# Patient Record
Sex: Male | Born: 1975 | Race: White | Hispanic: No | Marital: Married | Smoking: Never smoker
Health system: Southern US, Community
[De-identification: ages and names within clinical notes are randomized; demographics above are authoritative.]

## PROBLEM LIST (undated history)

## (undated) DIAGNOSIS — Z87442 Personal history of urinary calculi: Secondary | ICD-10-CM

## (undated) DIAGNOSIS — F32A Depression, unspecified: Secondary | ICD-10-CM

## (undated) DIAGNOSIS — K219 Gastro-esophageal reflux disease without esophagitis: Secondary | ICD-10-CM

## (undated) DIAGNOSIS — T7840XA Allergy, unspecified, initial encounter: Secondary | ICD-10-CM

## (undated) DIAGNOSIS — K5792 Diverticulitis of intestine, part unspecified, without perforation or abscess without bleeding: Secondary | ICD-10-CM

## (undated) DIAGNOSIS — B3781 Candidal esophagitis: Secondary | ICD-10-CM

## (undated) DIAGNOSIS — B019 Varicella without complication: Secondary | ICD-10-CM

## (undated) DIAGNOSIS — F329 Major depressive disorder, single episode, unspecified: Secondary | ICD-10-CM

## (undated) HISTORY — DX: Allergy, unspecified, initial encounter: T78.40XA

## (undated) HISTORY — PX: ROTATOR CUFF REPAIR: SHX139

## (undated) HISTORY — PX: VASECTOMY: SHX75

## (undated) HISTORY — DX: Major depressive disorder, single episode, unspecified: F32.9

## (undated) HISTORY — DX: Varicella without complication: B01.9

## (undated) HISTORY — DX: Gastro-esophageal reflux disease without esophagitis: K21.9

## (undated) HISTORY — DX: Depression, unspecified: F32.A

## (undated) HISTORY — DX: Diverticulitis of intestine, part unspecified, without perforation or abscess without bleeding: K57.92

## (undated) HISTORY — DX: Personal history of urinary calculi: Z87.442

## (undated) HISTORY — DX: Candidal esophagitis: B37.81

---

## 2010-03-26 HISTORY — PX: HERNIA REPAIR: SHX51

## 2011-08-27 ENCOUNTER — Encounter: Payer: Self-pay | Admitting: Family Medicine

## 2011-08-27 ENCOUNTER — Encounter: Payer: Self-pay | Admitting: Gastroenterology

## 2011-08-27 ENCOUNTER — Ambulatory Visit (INDEPENDENT_AMBULATORY_CARE_PROVIDER_SITE_OTHER): Payer: 59 | Admitting: Family Medicine

## 2011-08-27 VITALS — BP 118/90 | HR 72 | Temp 97.7°F | Resp 12 | Ht 69.0 in | Wt 167.0 lb

## 2011-08-27 DIAGNOSIS — K219 Gastro-esophageal reflux disease without esophagitis: Secondary | ICD-10-CM

## 2011-08-27 DIAGNOSIS — N200 Calculus of kidney: Secondary | ICD-10-CM

## 2011-08-27 DIAGNOSIS — K222 Esophageal obstruction: Secondary | ICD-10-CM

## 2011-08-27 DIAGNOSIS — J309 Allergic rhinitis, unspecified: Secondary | ICD-10-CM

## 2011-08-27 MED ORDER — AZELASTINE HCL 0.1 % NA SOLN: 1.0000 | Freq: Two times a day (BID) | NASAL | Status: DC

## 2011-08-27 MED ORDER — OLOPATADINE HCL 0.1 % OP SOLN: 1.0000 [drp] | Freq: Two times a day (BID) | OPHTHALMIC | Status: DC

## 2011-08-27 NOTE — Progress Notes (Signed)
  Subjective:    Patient ID: Albert Compton, male    DOB: 1975-12-19, 36 y.o.   MRN: [id number]  HPI  New patient to establish care. Patient has history of GERD which apparently has been more silent type GERD. Had 2 previous endoscopies with reported esophageal stricture dilatation. Most recently over a year ago when he lived in [place]. He's had progressive symptoms of dysphagia over the past several months and frequently unable to finish meals and has to regurgitate food. Previously has taken some Prilosec but did not see much improvement. He is currently not taking any acid suppressors. Increased caffeine use-coffee. Nonsmoker. No alcohol use.  Other problems include history of calcium oxalate kidney stones x4. He has had reported one episode of mild diverticulitis noted on CT scan. He has seasonal allergies which have been particularly bothersome since he moved to [place]. Uses Astelin nasal along with Patanol eyedrops and Allegra.    Review of Systems  Constitutional: Negative for fever, chills and unexpected weight change.  HENT: Positive for congestion and sinus pressure. Negative for sore throat, trouble swallowing and voice change.   Eyes: Positive for redness and itching. Negative for pain and visual disturbance.  Respiratory: Negative for cough, shortness of breath and wheezing.   Cardiovascular: Negative for chest pain.  Neurological: Negative for dizziness and headaches.  Hematological: Negative for adenopathy.       Objective:   Physical Exam  Constitutional: He appears well-developed and well-nourished.  HENT:  Right Ear: External ear normal.  Left Ear: External ear normal.  Mouth/Throat: Oropharynx is clear and moist.  Neck: Neck supple. No thyromegaly present.  Cardiovascular: Normal rate and regular rhythm.   Pulmonary/Chest: Effort normal and breath sounds normal. No respiratory distress. He has no wheezes. He has no rales.  Abdominal: Soft. He exhibits no  distension and no mass. There is no tenderness. There is no rebound and no guarding.  Musculoskeletal: He exhibits no edema.  Lymphadenopathy:    He has no cervical adenopathy.          Assessment & Plan:  #1 history of GERD with reported esophageal stricture presenting now with progressive dysphagia. Samples Dexilant 60 mg 1 daily. Reduce caffeine use. Referral to local gastroenterologist. #2 allergic rhinitis. Refill Astelin and Patanol eyedrops.  #3 history of calcium oxalate kidney stones. Low oxalate diet and plenty of fluids.

## 2011-08-27 NOTE — Patient Instructions (Signed)
Dexilant 60 mg one daily We will call you regarding appointment with gastroenterology

## 2011-09-01 ENCOUNTER — Encounter (HOSPITAL_COMMUNITY): Payer: Self-pay

## 2011-09-01 ENCOUNTER — Emergency Department (HOSPITAL_COMMUNITY)
Admission: EM | Admit: 2011-09-01 | Discharge: 2011-09-01 | Disposition: A | Discharge status: Home / Self Care | Payer: 59 | Source: Home / Self Care | Attending: Emergency Medicine | Admitting: Emergency Medicine

## 2011-09-01 DIAGNOSIS — J020 Streptococcal pharyngitis: Secondary | ICD-10-CM

## 2011-09-01 LAB — POCT RAPID STREP A: Streptococcus, Group A Screen (Direct): POSITIVE — AB

## 2011-09-01 MED ORDER — AMOXICILLIN 250 MG/5ML PO SUSR: ORAL | Status: DC

## 2011-09-01 NOTE — ED Provider Notes (Signed)
History     CSN: [id number]  Arrival date & time 09/01/11  1210   First MD Initiated Contact with Patient 09/01/11 1253      Chief Complaint  Patient presents with  . Sore Throat    (Consider location/radiation/quality/duration/timing/severity/associated sxs/prior treatment) HPI Comments: Shouldn't presents urgent care complaining of a sore throat started yesterday they both report that a child at home also tested positive for strep during the course of last week. Patient denies any fevers but does hurt to swallow, denies any shortness of breath or cough. She also denies constitutional symptoms such as body aches or chills or changes in appetite, or arthralgias  Patient is a 36 y.o. male presenting with pharyngitis. The history is provided by the patient.  Sore Throat This is a new problem. The current episode started yesterday. The problem occurs constantly. Pertinent negatives include no abdominal pain and no shortness of breath. The symptoms are aggravated by swallowing. The symptoms are relieved by nothing. He has tried nothing for the symptoms.    Past Medical History  Diagnosis Date  . Chicken pox   . Diverticulitis   . GERD (gastroesophageal reflux disease)   . Allergy   . Kidney stones     Past Surgical History  Procedure Date  . Hernia repair 2012    umbilical  . Vasectomy     Family History  Problem Relation Age of Onset  . Alcohol abuse Mother   . Hypertension Mother   . Alcohol abuse Father   . Mental illness Father   . Alcohol abuse Sister   . Mental illness Sister   . Alcohol abuse Maternal Aunt   . Alcohol abuse Maternal Uncle   . Alcohol abuse Paternal Aunt   . Alcohol abuse Paternal Uncle     History  Substance Use Topics  . Smoking status: Never Smoker   . Smokeless tobacco: Not on file  . Alcohol Use: No      Review of Systems  Constitutional: Negative for fever and activity change.  HENT: Positive for sore throat. Negative for ear  pain, facial swelling and neck pain.   Respiratory: Negative for cough and shortness of breath.   Gastrointestinal: Negative for abdominal pain.  Skin: Negative for rash.    Allergies  Review of patient's allergies indicates no known allergies.  Home Medications   Current Outpatient Rx  Name Route Sig Dispense Refill  . AMOXICILLIN 250 MG/5ML PO SUSR  10 cc po tid x 10 days 300 mL 0  . AZELASTINE HCL 137 MCG/SPRAY NA SOLN Nasal Place 1 spray into the nose 2 (two) times daily. Use in each nostril as directed 30 mL 11  . FEXOFENADINE HCL 180 MG PO TABS Oral Take 180 mg by mouth daily.    Marland Kitchen LORATADINE-PSEUDOEPHEDRINE ER 10-240 MG PO TB24 Oral Take 1 tablet by mouth daily.    . OLOPATADINE HCL 0.1 % OP SOLN Both Eyes Place 1 drop into both eyes 2 (two) times daily. 5 mL 12    BP 100/75  Pulse 84  Temp(Src) 98.3 F (36.8 C) (Oral)  Resp 18  SpO2 100%  Physical Exam  Vitals reviewed. Constitutional: He appears well-developed and well-nourished.  HENT:  Right Ear: Tympanic membrane normal.  Left Ear: Tympanic membrane normal.  Mouth/Throat: Uvula is midline. Posterior oropharyngeal erythema present.  Eyes: Conjunctivae are normal.  Neck: Neck supple. No thyromegaly present.  Abdominal: There is no tenderness.  Skin: No rash noted. No erythema.  ED Course  Procedures (including critical care time)  Labs Reviewed  POCT RAPID STREP A ([facility]RG CARE ONLY) - Abnormal; Notable for the following:    Streptococcus, Group A Screen (Direct) POSITIVE (*)    All other components within normal limits   No results found.   1. Streptococcal pharyngitis       MDM  Positive contact at home with known positive strep and symptomatic since yesterday. Exam with marked pharyngitis. Patient requesting antibiotic suspension as he's undergoing some gastrointestinal evaluation for some esophageal strictures and has difficulty swallowing pills.        Jimmie Molly, MD 09/01/11 [phone]

## 2011-09-01 NOTE — Discharge Instructions (Signed)
Strep Throat Strep throat is an infection of the throat caused by a bacteria named Streptococcus pyogenes. Your caregiver may call the infection streptococcal "tonsillitis" or "pharyngitis" depending on whether there are signs of inflammation in the tonsils or back of the throat. Strep throat is most common in children from 28 to 35 years old during the cold months of the year, but it can occur in people of any age during any season. This infection is spread from person to person (contagious) through coughing, sneezing, or other close contact. SYMPTOMS   Fever or chills.   Painful, swollen, red tonsils or throat.   Pain or difficulty when swallowing.   White or yellow spots on the tonsils or throat.   Swollen, tender lymph nodes or "glands" of the neck or under the jaw.   Red rash all over the body (rare).  DIAGNOSIS  Many different infections can cause the same symptoms. A test must be done to confirm the diagnosis so the right treatment can be given. A "rapid strep test" can help your caregiver make the diagnosis in a few minutes. If this test is not available, a light swab of the infected area can be used for a throat culture test. If a throat culture test is done, results are usually available in a day or two. TREATMENT  Strep throat is treated with antibiotic medicine. HOME CARE INSTRUCTIONS   Gargle with 1 tsp of salt in 1 cup of warm water, 3 to 4 times per day or as needed for comfort.   Family members who also have a sore throat or fever should be tested for strep throat and treated with antibiotics if they have the strep infection.   Make sure everyone in your household washes their hands well.   Do not share food, drinking cups, or personal items that could cause the infection to spread to others.   You may need to eat a soft food diet until your sore throat gets better.   Drink enough water and fluids to keep your urine clear or pale yellow. This will help prevent  dehydration.   Get plenty of rest.   Stay home from school, daycare, or work until you have been on antibiotics for 24 hours.   Only take over-the-counter or prescription medicines for pain, discomfort, or fever as directed by your caregiver.   If antibiotics are prescribed, take them as directed. Finish them even if you start to feel better.  SEEK MEDICAL CARE IF:   The glands in your neck continue to enlarge.   You develop a rash, cough, or earache.   You cough up green, yellow-brown, or bloody sputum.   You have pain or discomfort not controlled by medicines.   Your problems seem to be getting worse rather than better.  SEEK IMMEDIATE MEDICAL CARE IF:   You develop any new symptoms such as vomiting, severe headache, stiff or painful neck, chest pain, shortness of breath, or trouble swallowing.   You develop severe throat pain, drooling, or changes in your voice.   You develop swelling of the neck, or the skin on the neck becomes red and tender.   You have a fever.   You develop signs of dehydration, such as fatigue, dry mouth, and decreased urination.   You become increasingly sleepy, or you cannot wake up completely.  Document Released: 03/09/2000 Document Revised: 03/01/2011 Document Reviewed: 05/11/2010 [facility] Patient Information 2012 [place], [place].Strep Throat Strep throat is an infection of the throat caused by  a bacteria named Streptococcus pyogenes. Your caregiver may call the infection streptococcal "tonsillitis" or "pharyngitis" depending on whether there are signs of inflammation in the tonsils or back of the throat. Strep throat is most common in children from 60 to 38 years old during the cold months of the year, but it can occur in people of any age during any season. This infection is spread from person to person (contagious) through coughing, sneezing, or other close contact. SYMPTOMS   Fever or chills.   Painful, swollen, red tonsils or throat.   Pain  or difficulty when swallowing.   White or yellow spots on the tonsils or throat.   Swollen, tender lymph nodes or "glands" of the neck or under the jaw.   Red rash all over the body (rare).  DIAGNOSIS  Many different infections can cause the same symptoms. A test must be done to confirm the diagnosis so the right treatment can be given. A "rapid strep test" can help your caregiver make the diagnosis in a few minutes. If this test is not available, a light swab of the infected area can be used for a throat culture test. If a throat culture test is done, results are usually available in a day or two. TREATMENT  Strep throat is treated with antibiotic medicine. HOME CARE INSTRUCTIONS   Gargle with 1 tsp of salt in 1 cup of warm water, 3 to 4 times per day or as needed for comfort.   Family members who also have a sore throat or fever should be tested for strep throat and treated with antibiotics if they have the strep infection.   Make sure everyone in your household washes their hands well.   Do not share food, drinking cups, or personal items that could cause the infection to spread to others.   You may need to eat a soft food diet until your sore throat gets better.   Drink enough water and fluids to keep your urine clear or pale yellow. This will help prevent dehydration.   Get plenty of rest.   Stay home from school, daycare, or work until you have been on antibiotics for 24 hours.   Only take over-the-counter or prescription medicines for pain, discomfort, or fever as directed by your caregiver.   If antibiotics are prescribed, take them as directed. Finish them even if you start to feel better.  SEEK MEDICAL CARE IF:   The glands in your neck continue to enlarge.   You develop a rash, cough, or earache.   You cough up green, yellow-brown, or bloody sputum.   You have pain or discomfort not controlled by medicines.   Your problems seem to be getting worse rather than  better.  SEEK IMMEDIATE MEDICAL CARE IF:   You develop any new symptoms such as vomiting, severe headache, stiff or painful neck, chest pain, shortness of breath, or trouble swallowing.   You develop severe throat pain, drooling, or changes in your voice.   You develop swelling of the neck, or the skin on the neck becomes red and tender.   You have a fever.   You develop signs of dehydration, such as fatigue, dry mouth, and decreased urination.   You become increasingly sleepy, or you cannot wake up completely.  Document Released: 03/09/2000 Document Revised: 03/01/2011 Document Reviewed: 05/11/2010 [facility] Patient Information 2012 [place], [place].

## 2011-09-01 NOTE — ED Notes (Signed)
Pt has sorethroat and difficulty swallowing since yesterday.  He has had two esophageal dilatations in the past and has appt with GI in near future.

## 2011-09-21 ENCOUNTER — Ambulatory Visit (INDEPENDENT_AMBULATORY_CARE_PROVIDER_SITE_OTHER): Payer: 59 | Admitting: Gastroenterology

## 2011-09-21 ENCOUNTER — Other Ambulatory Visit (INDEPENDENT_AMBULATORY_CARE_PROVIDER_SITE_OTHER): Payer: 59

## 2011-09-21 ENCOUNTER — Encounter: Payer: Self-pay | Admitting: Gastroenterology

## 2011-09-21 VITALS — BP 104/72 | HR 88 | Ht 69.0 in | Wt 165.0 lb

## 2011-09-21 DIAGNOSIS — K219 Gastro-esophageal reflux disease without esophagitis: Secondary | ICD-10-CM

## 2011-09-21 DIAGNOSIS — R1314 Dysphagia, pharyngoesophageal phase: Secondary | ICD-10-CM

## 2011-09-21 DIAGNOSIS — T7840XA Allergy, unspecified, initial encounter: Secondary | ICD-10-CM

## 2011-09-21 DIAGNOSIS — R197 Diarrhea, unspecified: Secondary | ICD-10-CM

## 2011-09-21 DIAGNOSIS — R1310 Dysphagia, unspecified: Secondary | ICD-10-CM

## 2011-09-21 DIAGNOSIS — Z889 Allergy status to unspecified drugs, medicaments and biological substances status: Secondary | ICD-10-CM

## 2011-09-21 LAB — IBC PANEL
Iron: 66 ug/dL (ref 42–165)
Transferrin: 226.1 mg/dL (ref 212.0–360.0)

## 2011-09-21 LAB — COMPREHENSIVE METABOLIC PANEL
Albumin: 4.1 g/dL (ref 3.5–5.2)
BUN: 13 mg/dL (ref 6–23)
Calcium: 9.2 mg/dL (ref 8.4–10.5)
Chloride: 107 mEq/L (ref 96–112)
Glucose, Bld: 96 mg/dL (ref 70–99)
Potassium: 4.4 mEq/L (ref 3.5–5.1)

## 2011-09-21 LAB — CBC WITH DIFFERENTIAL/PLATELET
Basophils Relative: 0.5 % (ref 0.0–3.0)
Eosinophils Absolute: 0.8 10*3/uL — ABNORMAL HIGH (ref 0.0–0.7)
Eosinophils Relative: 11 % — ABNORMAL HIGH (ref 0.0–5.0)
Lymphocytes Relative: 32.3 % (ref 12.0–46.0)
MCHC: 34 g/dL (ref 30.0–36.0)
Neutrophils Relative %: 48.4 % (ref 43.0–77.0)
RBC: 5.22 Mil/uL (ref 4.22–5.81)
WBC: 7 10*3/uL (ref 4.5–10.5)

## 2011-09-21 LAB — VITAMIN B12: Vitamin B-12: 238 pg/mL (ref 211–911)

## 2011-09-21 LAB — TSH: TSH: 2.35 u[IU]/mL (ref 0.35–5.50)

## 2011-09-21 NOTE — Patient Instructions (Addendum)
You have been scheduled for an endoscopy with fentanyl and versed (due to egg allergy). Please follow written instructions given to you at your visit today. Your physician has requested that you go to the basement for the following lab work before leaving today: [place] Panel, Anemia Profile, Celiac 10 Panel. Please follow a dysphagia level 1 diet. A handout has been given to you regarding this. CC: Dr Evelena Peat

## 2011-09-21 NOTE — Progress Notes (Signed)
History of Present Illness:  This is a very nice 36 year old Caucasian male self referred for evaluation of recurrent dysphagia over the last 5-6 years. He previously lived in [place] and had esophageal dilatation on 2 occasions. Apparently he had" rings" and was told that he had" silent GERD". He has not had any typical acid reflux symptoms of regurgitation or burning substernal chest pain. He is been on PPI agents in the past without any improvement. Currently he has solid food dysphagia in the upper esophageal area and is on a pured diet. He also has IBS with gastrointestinal or urgency with fried foods and junk food. Apparently previous CT scan of the abdomen showed" diverticulosis". Patient not had melena, hematochezia, hepatobiliary or systemic complaints. He does not smoke, abuse NSAIDs or alcohol. Family history is noncontributory. He does have recurrent calcium oxalate kidney stones. Patient also has a history of marked allergies as previously been on allergy shots and antihistamines. He currently uses Astelin nasal spray. He denies a specific food allergies, and apparently previous serologies for celiac disease were negative.  I have reviewed this patient's present history, medical and surgical past history, allergies and medications.     ROS: The remainder of the 10 point ROS is negative... no recurrent skin rashes, joint pains, oral stomatitis, or neurological problems. 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     Physical Exam: Blood pressure 104/72, pulse 80 and regular, and weight 165 pounds. General well developed well nourished patient in no acute distress, appearing their stated age Eyes PERRLA, no icterus, fundoscopic exam per opthamologist Skin no lesions noted Neck supple, no adenopathy, no thyroid enlargement, no tenderness Chest clear to percussion and auscultation Heart no significant murmurs, gallops or rubs noted Abdomen no hepatosplenomegaly masses or tenderness, BS normal.    Extremities no acute joint lesions, edema, phlebitis or evidence of cellulitis. Neurologic patient oriented x 3, cranial nerves intact, no focal neurologic deficits noted. Psychological mental status normal and normal affect.  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 He certainly has no symptoms of chronic GERD. We will proceed with endoscopy and esophageal biopsies with dilatation as indicated. I have placed him on a very restricted Level One dysphagia diet pending endoscopy and probable dilation. 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 I have ordered labs reviewed including repeat celiac serologies. The risk and benefits of these procedures have been explained in detail, his wife is in attendance, and they have agreed to proceed as planned. Because of his egg allergy, he is not a candidate for propofol administration. We will try to secure his records from his previous physicians in [place] for review.  Encounter Diagnosis  Name Primary?  . GERD (gastroesophageal reflux disease) Yes

## 2011-09-24 ENCOUNTER — Other Ambulatory Visit: Payer: Self-pay

## 2011-09-24 DIAGNOSIS — E538 Deficiency of other specified B group vitamins: Secondary | ICD-10-CM

## 2011-09-24 LAB — CELIAC PANEL 10
Gliadin IgA: 4.2 U/mL (ref <20)
Tissue Transglut Ab: 3.6 U/mL (ref <20)
Tissue Transglutaminase Ab, IgA: 3.8 U/mL (ref <20)

## 2011-09-24 MED ORDER — CYANOCOBALAMIN 1000 MCG/ML IJ SOLN
1000.0000 ug | INTRAMUSCULAR | Status: AC
  Administered 2011-09-25 – 2011-10-02 (×2): 1000 ug via INTRAMUSCULAR

## 2011-09-25 ENCOUNTER — Ambulatory Visit (INDEPENDENT_AMBULATORY_CARE_PROVIDER_SITE_OTHER): Payer: 59 | Admitting: Gastroenterology

## 2011-09-25 DIAGNOSIS — E538 Deficiency of other specified B group vitamins: Secondary | ICD-10-CM

## 2011-09-25 MED ORDER — CYANOCOBALAMIN 1000 MCG/ML IJ SOLN
1000.0000 ug | Freq: Once | INTRAMUSCULAR | Status: AC
  Administered 2011-10-09: 1000 ug via INTRAMUSCULAR

## 2011-09-25 NOTE — Patient Instructions (Signed)
Pt given copy of his lab work done 09/21/11 per his request.

## 2011-10-02 ENCOUNTER — Ambulatory Visit (INDEPENDENT_AMBULATORY_CARE_PROVIDER_SITE_OTHER): Payer: 59 | Admitting: Gastroenterology

## 2011-10-02 DIAGNOSIS — E538 Deficiency of other specified B group vitamins: Secondary | ICD-10-CM

## 2011-10-03 ENCOUNTER — Other Ambulatory Visit: Payer: Self-pay

## 2011-10-03 ENCOUNTER — Ambulatory Visit (AMBULATORY_SURGERY_CENTER): Payer: 59 | Admitting: Gastroenterology

## 2011-10-03 ENCOUNTER — Other Ambulatory Visit: Payer: 59

## 2011-10-03 ENCOUNTER — Encounter: Payer: Self-pay | Admitting: Gastroenterology

## 2011-10-03 VITALS — BP 116/82 | HR 63 | Temp 96.5°F | Resp 19 | Ht 69.0 in | Wt 165.0 lb

## 2011-10-03 DIAGNOSIS — B3781 Candidal esophagitis: Secondary | ICD-10-CM

## 2011-10-03 DIAGNOSIS — K209 Esophagitis, unspecified: Secondary | ICD-10-CM

## 2011-10-03 DIAGNOSIS — K222 Esophageal obstruction: Secondary | ICD-10-CM

## 2011-10-03 DIAGNOSIS — K219 Gastro-esophageal reflux disease without esophagitis: Secondary | ICD-10-CM

## 2011-10-03 DIAGNOSIS — R1314 Dysphagia, pharyngoesophageal phase: Secondary | ICD-10-CM

## 2011-10-03 MED ORDER — FLUCONAZOLE 100 MG PO TABS: ORAL_TABLET | ORAL | Status: DC

## 2011-10-03 MED ORDER — SODIUM CHLORIDE 0.9 % IV SOLN: 500.0000 mL | INTRAVENOUS | Status: DC

## 2011-10-03 NOTE — Progress Notes (Signed)
Patient did not experience any of the following events: a burn prior to discharge; a fall within the facility; wrong site/side/patient/procedure/implant event; or a hospital transfer or hospital admission upon discharge from the facility. (G8907) Patient did not have preoperative order for IV antibiotic SSI prophylaxis. (G8918)  

## 2011-10-03 NOTE — Progress Notes (Signed)
No complaints noted in the recovery room. Maw   

## 2011-10-03 NOTE — Patient Instructions (Addendum)
Please follow the dilatation diet the rest of the day.  Rx was sent to the [facility] for Diflucan to be picked up.  You are to have blood work today before discharge to your home.  You have a follow up appointment on Friday, October 19, 2011 at 9:45am on the 3rd floor.  You may resume your current medications today.  Call if any questions or concerns.    YOU HAD AN ENDOSCOPIC PROCEDURE TODAY AT THE  ENDOSCOPY CENTER: Refer to the procedure report that was given to you for any specific questions about what was found during the examination.  If the procedure report does not answer your questions, please call your gastroenterologist to clarify.  If you requested that your care partner not be given the details of your procedure findings, then the procedure report has been included in a sealed envelope for you to review at your convenience later.  YOU SHOULD EXPECT: Some feelings of bloating in the abdomen. Passage of more gas than usual.  Walking can help get rid of the air that was put into your GI tract during the procedure and reduce the bloating. If you had a lower endoscopy (such as a colonoscopy or flexible sigmoidoscopy) you may notice spotting of blood in your stool or on the toilet paper. If you underwent a bowel prep for your procedure, then you may not have a normal bowel movement for a few days.  DIET:   Drink plenty of fluids but you should avoid alcoholic beverages for 24 hours.  Please follow the dilatation diet the rest of today.  ACTIVITY: Your care partner should take you home directly after the procedure.  You should plan to take it easy, moving slowly for the rest of the day.  You can resume normal activity the day after the procedure however you should NOT DRIVE or use heavy machinery for 24 hours (because of the sedation medicines used during the test).    SYMPTOMS TO REPORT IMMEDIATELY: A gastroenterologist can be reached at any hour.  During normal business  hours, 8:30 AM to 5:00 PM Monday through Friday, call [phone].  After hours and on weekends, please call the GI answering service at [phone] who will take a message and have the physician on call contact you.     Following upper endoscopy (EGD)  Vomiting of blood or coffee ground material  New chest pain or pain under the shoulder blades  Painful or persistently difficult swallowing  New shortness of breath  Fever of 100F or higher  Black, tarry-looking stools  FOLLOW UP: If any biopsies were taken you will be contacted by phone or by letter within the next 1-3 weeks.  Call your gastroenterologist if you have not heard about the biopsies in 3 weeks.  Our staff will call the home number listed on your records the next business day following your procedure to check on you and address any questions or concerns that you may have at that time regarding the information given to you following your procedure. This is a courtesy call and so if there is no answer at the home number and we have not heard from you through the emergency physician on call, we will assume that you have returned to your regular daily activities without incident.  SIGNATURES/CONFIDENTIALITY: You and/or your care partner have signed paperwork which will be entered into your electronic medical record.  These signatures attest to the fact that that the information above on  your After Visit Summary has been reviewed and is understood.  Full responsibility of the confidentiality of this discharge information lies with you and/or your care-partner.

## 2011-10-03 NOTE — Op Note (Signed)
[facility] [address] [place], [place]  [address]  ENDOSCOPY PROCEDURE REPORT  PATIENT:  Albert, Compton  MR#:  [id number] BIRTHDATE:  1975/10/07, 35 yrs. old  GENDER:  male  ENDOSCOPIST:  Vania Rea. Jarold Motto, MD, [facility] Referred by:  PROCEDURE DATE:  10/03/2011 PROCEDURE:  EGD with biopsy, [address] ASA CLASS:  Class I INDICATIONS:  dysphagia SEVERE DYSPHAGIA.  MEDICATIONS:   propofol (Diprivan) 230 mg IV, glycopyrrolate (Robinal) 0.2 mg IV TOPICAL ANESTHETIC:  Exactacain Spray  DESCRIPTION OF PROCEDURE:   After the risks and benefits of the procedure were explained, informed consent was obtained.  The LB-GIF Q180 [id number] endoscope was introduced through the mouth and advanced to the second portion of the duodenum.  The instrument was slowly withdrawn as the mucosa was fully examined. <<PROCEDUREIMAGES>>  Candida esophagitis. DENSE WHITE CANDIDA PLAQUES IN PROXIMAL ESOPHSGITIS.BX.#2 DONE.  An esophageal ring was found. MULTIPLE DISTAL RINGS BIOPSIED AND DILATED #81F MALONEY DILATOR.SLIGHT HEME NOTED.  Otherwise the examination was normal.    Retroflexed views revealed no abnormalities.    The scope was then withdrawn from the patient and the procedure completed.  COMPLICATIONS:  None  ENDOSCOPIC IMPRESSION: 1) Candida esophagitis 2) Ring, esophageal 3) Otherwise normal examination 1.RECURRENT CANDIDA ESOPHAGITIS. 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 RECOMMENDATIONS: 1) Await biopsy results 2) Clear liquids until, then soft foods rest iof day. Resume prior diet tomorrow. DIFLUCAN 100MG  BID X1,THEN 100MG ./DAY X 10.HE MAY NEED PO BUDESONIDE.  ______________________________ Vania Rea. Jarold Motto, MD, [facility]  CC:  Evelena Peat, MD  n. Rosalie DoctorVania Rea. Patterson at 10/03/2011 03:41 PM  Lyman Bishop, [phone]

## 2011-10-04 ENCOUNTER — Telehealth: Payer: Self-pay | Admitting: *Deleted

## 2011-10-04 NOTE — Telephone Encounter (Signed)
  Follow up Call-  Call back number 10/03/2011  Post procedure Call Back phone  # [phone]  Permission to leave phone message Yes     Patient questions:  Do you have a fever, pain , or abdominal swelling? no Pain Score  0 *  Have you tolerated food without any problems? yes  Have you been able to return to your normal activities? yes  Do you have any questions about your discharge instructions: Diet   no Medications  no Follow up visit  no  Do you have questions or concerns about your Care? no  Actions: * If pain score is 4 or above: No action needed, pain <4.

## 2011-10-05 ENCOUNTER — Telehealth: Payer: Self-pay | Admitting: *Deleted

## 2011-10-05 LAB — IMMUNOFIXATION ELECTROPHORESIS: IgG (Immunoglobin G), Serum: 1440 mg/dL (ref 650–1600)

## 2011-10-05 LAB — HIV 1/2 CONFIRMATION: HIV-1 antibody: NEGATIVE

## 2011-10-05 NOTE — Telephone Encounter (Signed)
Message copied by Florene Glen on Fri Oct 05, 2011 10:31 AM ------      Message from: Jarold Motto, DAVID R      Created: Fri Oct 05, 2011  8:29 AM       Is this report back ???

## 2011-10-05 NOTE — Telephone Encounter (Signed)
Called [facility] for update on HIV test; per [facility], confirmation usually takes about 5 days.

## 2011-10-08 ENCOUNTER — Telehealth: Payer: Self-pay | Admitting: *Deleted

## 2011-10-08 MED ORDER — FLUTICASONE PROPIONATE HFA 220 MCG/ACT IN AERO: INHALATION_SPRAY | RESPIRATORY_TRACT | Status: DC

## 2011-10-08 NOTE — Telephone Encounter (Signed)
Per Dr Jarold Motto, notify pt to stop the Diflucan, begin the flovent inhalers sprayed into mouth and swallow x 1 month; all HIV tests are negative. Changed his f/u appt to late August to assess therapy. lmom for pt to call back.

## 2011-10-08 NOTE — Telephone Encounter (Signed)
Notified pt of dx and instructed on how to administer the inhaler x 1 month. Also, he is negative for HIV. Changed his f/u with Dr Jarold Motto to assess response to flovent; pt stated understanding.

## 2011-10-09 ENCOUNTER — Ambulatory Visit (INDEPENDENT_AMBULATORY_CARE_PROVIDER_SITE_OTHER): Payer: 59 | Admitting: Gastroenterology

## 2011-10-09 DIAGNOSIS — E538 Deficiency of other specified B group vitamins: Secondary | ICD-10-CM

## 2011-10-09 MED ORDER — CYANOCOBALAMIN 1000 MCG/ML IJ SOLN: 1000.0000 ug | Freq: Once | INTRAMUSCULAR | Status: DC

## 2011-10-11 ENCOUNTER — Telehealth: Payer: Self-pay | Admitting: Gastroenterology

## 2011-10-11 NOTE — Telephone Encounter (Signed)
lmom for pt or mom  to call back. Called [facility] to find an alternate for Flovent. Qvar is cheaper and she will see if it's just as effective and call me back.

## 2011-10-11 NOTE — Telephone Encounter (Signed)
Spoke with Lanora Manis, Pharmacist at [facility] who suggests Alvesco which is a new inhaler and it will cost the pt $25 co pay.

## 2011-10-11 NOTE — Addendum Note (Signed)
Addended by: Florene Glen on: 10/11/2011 02:03 PM   Modules accepted: Orders

## 2011-10-19 ENCOUNTER — Ambulatory Visit: Payer: 59 | Admitting: Gastroenterology

## 2011-11-09 ENCOUNTER — Encounter: Payer: 59 | Admitting: Gastroenterology

## 2011-11-09 ENCOUNTER — Other Ambulatory Visit: Payer: Self-pay | Admitting: Gastroenterology

## 2011-11-09 DIAGNOSIS — E538 Deficiency of other specified B group vitamins: Secondary | ICD-10-CM

## 2011-11-09 MED ORDER — CYANOCOBALAMIN 1000 MCG/ML IJ SOLN: 1000.0000 ug | Freq: Once | INTRAMUSCULAR | Status: AC

## 2011-11-09 MED ORDER — CYANOCOBALAMIN 1000 MCG/ML IJ SOLN: 1000.0000 ug | Freq: Once | INTRAMUSCULAR | Status: DC

## 2011-11-19 ENCOUNTER — Encounter: Payer: Self-pay | Admitting: *Deleted

## 2011-11-20 ENCOUNTER — Ambulatory Visit (INDEPENDENT_AMBULATORY_CARE_PROVIDER_SITE_OTHER): Payer: 59 | Admitting: Gastroenterology

## 2011-11-20 ENCOUNTER — Encounter: Payer: Self-pay | Admitting: Gastroenterology

## 2011-11-20 VITALS — BP 94/62 | HR 68 | Ht 68.5 in | Wt 170.5 lb

## 2011-11-20 DIAGNOSIS — K200 Eosinophilic esophagitis: Secondary | ICD-10-CM

## 2011-11-20 DIAGNOSIS — Z889 Allergy status to unspecified drugs, medicaments and biological substances status: Secondary | ICD-10-CM

## 2011-11-20 DIAGNOSIS — T7840XA Allergy, unspecified, initial encounter: Secondary | ICD-10-CM

## 2011-11-20 NOTE — Progress Notes (Signed)
History of Present Illness: This is a 36 year old Caucasian male who was recently endoscoped because of recurrent dysphagia. 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 He currently is asymptomatic. He does have a history of multiple allergies and ENT problems. He denies a specific food allergies. Evaluation showed no evidence of acid reflux, and he denies acid reflux symptoms. Esophageal biopsies did not show any evidence of Candida infection. Workup for immune deficiency syndrome has been negative. In retrospect, the patient does appear to have some aches and sensitivity. Review of his labs shows a mild eosinophilia with 11% eosinophils and his peripheral blood. He uses when necessary Astelin nasal spray and Claritin D.    Current Medications, Allergies, Past Medical History, Past Surgical History, Family History and Social History were reviewed in [employer] record.   Assessment and plan: Recurrent eosinophilic esophagitis over a period of several years. He was recently evaluated in [place] and found to have eosinophilic esophagitis. I placed him on Tagamet 300 mg at bedtime as an H2 blocker, would suggest he continue regular Claritin, and have referred him for allergy evaluation with our allergist. We will see him on a when necessary basis as needed. Encounter Diagnosis  Name Primary?  . Eosinophilic esophagitis Yes

## 2011-11-20 NOTE — Patient Instructions (Addendum)
The Alvesco is 2 puffs in mouth then swallow Do Not inhale medication Follow up in 3 months

## 2012-01-04 ENCOUNTER — Institutional Professional Consult (permissible substitution): Payer: 59 | Admitting: Internal Medicine

## 2012-01-25 ENCOUNTER — Ambulatory Visit (INDEPENDENT_AMBULATORY_CARE_PROVIDER_SITE_OTHER): Payer: 59 | Admitting: Family Medicine

## 2012-01-25 ENCOUNTER — Encounter: Payer: Self-pay | Admitting: Family Medicine

## 2012-01-25 VITALS — BP 98/80 | Temp 97.4°F | Wt 171.0 lb

## 2012-01-25 DIAGNOSIS — G2581 Restless legs syndrome: Secondary | ICD-10-CM

## 2012-01-25 DIAGNOSIS — F988 Other specified behavioral and emotional disorders with onset usually occurring in childhood and adolescence: Secondary | ICD-10-CM

## 2012-01-25 MED ORDER — AMPHETAMINE-DEXTROAMPHET ER 20 MG PO CP24: 20.0000 mg | ORAL_CAPSULE | ORAL | Status: DC

## 2012-01-25 NOTE — Progress Notes (Signed)
  Subjective:    Patient ID: Albert Compton, male    DOB: Jul 22, 1975, 36 y.o.   MRN: [id number]  HPI  Here to discuss the following concerns  Patient preparing to go back to school. He is very concerned he may have adult attention deficit disorder. All his life he struggled in school work. He had very great difficulty getting through school. He relates difficulty staying on task and frequently making careless mistakes. He has difficulty sustaining attention in past. Frequently does not follow through on instructions and fails to complete assignments at work and at home. He has difficulty organizing tasks in activities. Sometimes avoids activities that require sustained effort such as reading. Frequently forgets things and loses things. Easily distracted by external stimuli. Frequently forgetful in daily activities. He's noticed these things in multiple environments. He recalls having these symptoms back in the earliest years of school before age 44. He has not had any other mood disorder or anxiety disorder.  Other issues wife is concerned about possible restless leg syndrome. Frequent jumpy legs at night. Difficulty falling asleep. Work second shift. Does drink significant caffeine early evenings. No history of iron deficiency by recent labs. Mild B12 deficiency which is been replaced.   Review of Systems  Constitutional: Negative for fatigue.  Eyes: Negative for visual disturbance.  Respiratory: Negative for cough, chest tightness and shortness of breath.   Cardiovascular: Negative for chest pain, palpitations and leg swelling.  Neurological: Negative for dizziness, syncope, weakness, light-headedness and headaches.       Objective:   Physical Exam  Constitutional: He is oriented to person, place, and time. He appears well-developed and well-nourished.  Neck: Neck supple. No thyromegaly present.  Cardiovascular: Normal rate and regular rhythm.   No murmur heard. Pulmonary/Chest: Effort normal  and breath sounds normal. No respiratory distress. He has no wheezes. He has no rales.  Musculoskeletal: He exhibits no edema.  Lymphadenopathy:    He has no cervical adenopathy.  Neurological: He is alert and oriented to person, place, and time. No cranial nerve deficit.  Psychiatric: He has a normal mood and affect. His behavior is normal. Judgment and thought content normal.          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 He meets DSM-IV criteria. Trial Adderall XR 20 mg once daily. Prescription for #30. Reassess one month. Reviewed possible side effects. #2 possible restless leg syndrome. Reduce caffeine use first. Reassess one month. No further improvement then consider either low-dose Klonopin or Mirapex

## 2012-01-25 NOTE — Patient Instructions (Addendum)
Attention Deficit Hyperactivity Disorder Attention deficit hyperactivity disorder (ADHD) is a problem with behavior issues based on the way the brain functions (neurobehavioral disorder). It is a common reason for behavior and academic problems in school. CAUSES  The cause of ADHD is unknown in most cases. It may run in families. It sometimes can be associated with learning disabilities and other behavioral problems. SYMPTOMS  There are 3 types of ADHD. The 3 types and some of the symptoms include:  Inattentive  Gets bored or distracted easily.  Loses or forgets things. Forgets to hand in homework.  Has trouble organizing or completing tasks.  Difficulty staying on task.  An inability to organize daily tasks and school work.  Leaving projects, chores, or homework unfinished.  Trouble paying attention or responding to details. Careless mistakes.  Difficulty following directions. Often seems like is not listening.  Dislikes activities that require sustained attention (like chores or homework).  Hyperactive-impulsive  Feels like it is impossible to sit still or stay in a seat. Fidgeting with hands and feet.  Trouble waiting turn.  Talking too much or out of turn. Interruptive.  Speaks or acts impulsively.  Aggressive, disruptive behavior.  Constantly busy or on the go, noisy.  Combined  Has symptoms of both of the above. Often children with ADHD feel discouraged about themselves and with school. They often perform well below their abilities in school. These symptoms can cause problems in home, school, and in relationships with peers. As children get older, the excess motor activities can calm down, but the problems with paying attention and staying organized persist. Most children do not outgrow ADHD but with good treatment can learn to cope with the symptoms. DIAGNOSIS  When ADHD is suspected, the diagnosis should be made by professionals trained in ADHD.  Diagnosis will  include:  Ruling out other reasons for the child's behavior.  The caregivers will check with the child's school and check their medical records.  They will talk to teachers and parents.  Behavior rating scales for the child will be filled out by those dealing with the child on a daily basis. A diagnosis is made only after all information has been considered. TREATMENT  Treatment usually includes behavioral treatment often along with medicines. It may include stimulant medicines. The stimulant medicines decrease impulsivity and hyperactivity and increase attention. Other medicines used include antidepressants and certain blood pressure medicines. Most experts agree that treatment for ADHD should address all aspects of the child's functioning. Treatment should not be limited to the use of medicines alone. Treatment should include structured classroom management. The parents must receive education to address rewarding good behavior, discipline, and limit-setting. Tutoring or behavioral therapy or both should be available for the child. If untreated, the disorder can have long-term serious effects into adolescence and adulthood. HOME CARE INSTRUCTIONS   Often with ADHD there is a lot of frustration among the family in dealing with the illness. There is often blame and anger that is not warranted. This is a life long illness. There is no way to prevent ADHD. In many cases, because the problem affects the family as a whole, the entire family may need help. A therapist can help the family find better ways to handle the disruptive behaviors and promote change. If the child is young, most of the therapist's work is with the parents. Parents will learn techniques for coping with and improving their child's behavior. Sometimes only the child with the ADHD needs counseling. Your caregivers can help   you make these decisions.  Children with ADHD may need help in organizing. Some helpful tips include:  Keep  routines the same every day from wake-up time to bedtime. Schedule everything. This includes homework and playtime. This should include outdoor and indoor recreation. Keep the schedule on the refrigerator or a bulletin board where it is frequently seen. Mark schedule changes as far in advance as possible.  Have a place for everything and keep everything in its place. This includes clothing, backpacks, and school supplies.  Encourage writing down assignments and bringing home needed books.  Offer your child a well-balanced diet. Breakfast is especially important for school performance. Children should avoid drinks with caffeine including:  Soft drinks.  Coffee.  Tea.  However, some older children (adolescents) may find these drinks helpful in improving their attention.  Children with ADHD need consistent rules that they can understand and follow. If rules are followed, give small rewards. Children with ADHD often receive, and expect, criticism. Look for good behavior and praise it. Set realistic goals. Give clear instructions. Look for activities that can foster success and self-esteem. Make time for pleasant activities with your child. Give lots of affection.  Parents are their children's greatest advocates. Learn as much as possible about ADHD. This helps you become a stronger and better advocate for your child. It also helps you educate your child's teachers and instructors if they feel inadequate in these areas. Parent support groups are often helpful. A national group with local chapters is called CHADD (Children and Adults with Attention Deficit Hyperactivity Disorder). PROGNOSIS  There is no cure for ADHD. Children with the disorder seldom outgrow it. Many find adaptive ways to accommodate the ADHD as they mature. SEEK MEDICAL CARE IF:  Your child has repeated muscle twitches, cough or speech outbursts.  Your child has sleep problems.  Your child has a marked loss of  appetite.  Your child develops depression.  Your child has new or worsening behavioral problems.  Your child develops dizziness.  Your child has a racing heart.  Your child has stomach pains.  Your child develops headaches. Document Released: 03/02/2002 Document Revised: 06/04/2011 Document Reviewed: 10/13/2007 ExitCare Patient Information 2013 [employer].  

## 2012-02-01 ENCOUNTER — Encounter: Payer: Self-pay | Admitting: Gastroenterology

## 2012-02-20 ENCOUNTER — Institutional Professional Consult (permissible substitution): Payer: 59 | Admitting: Internal Medicine

## 2012-02-25 ENCOUNTER — Ambulatory Visit (INDEPENDENT_AMBULATORY_CARE_PROVIDER_SITE_OTHER): Payer: 59 | Admitting: Family Medicine

## 2012-02-25 ENCOUNTER — Encounter: Payer: Self-pay | Admitting: Family Medicine

## 2012-02-25 VITALS — BP 112/68 | Temp 98.0°F | Wt 167.0 lb

## 2012-02-25 DIAGNOSIS — F988 Other specified behavioral and emotional disorders with onset usually occurring in childhood and adolescence: Secondary | ICD-10-CM | POA: Insufficient documentation

## 2012-02-25 NOTE — Progress Notes (Signed)
Subjective:     Patient ID: Albert Compton, male   DOB: Mar 11, 1976, 36 y.o.   MRN: [id number]  HPI Patient here for 38-month medical follow-up.  At last visit, patient reported symptoms concerning for adult ADD.  Refer to prior note.  He has plans to return to school and is concerned about being able to concentrate and complete tasks.  Has been on a trial of Adderall XR 20mg , which he says has greatly improved his focus and energy level.  He notes better completion of tasks at home instead of starting several without completing any as he was doing before trying medication.  Denies HA, palpitations, tachycardia, or decrease in appetite.  Also since last visit, pt has been taking melatonin to help sleep when he gets home from working second shift at 1:30am, which he states has resolved the sleep issues he had been having.    Review of Systems  Constitutional: Negative for appetite change and unexpected weight change.  Respiratory: Negative for chest tightness and shortness of breath.   Cardiovascular: Negative for palpitations.  Neurological: Negative for headaches.       Objective:   Physical Exam  Constitutional: He appears well-developed and well-nourished. No distress.  Cardiovascular: Normal rate, regular rhythm and normal heart sounds.   Pulmonary/Chest: Effort normal and breath sounds normal. No respiratory distress.       Assessment:     36 year old here for follow-up of adult ADD and sleep issues.    Plan:     1. Probable adult ADD: Adderall XR 20mg  appears to be working well and without major side effects.  No HA, BP changes, palpitations or decreased appetite.  Continue current regimen and follow-up again in 6 months. 2. Sleep: per pt, melatonin has helped resolve sleep concerns.  Continue with this and follow up if symptoms return for further evaluation.  Marthann Schiller, MS3    Agree with assessment and plan as per Marthann Schiller, MS 3 Evelena Peat MD

## 2012-03-06 ENCOUNTER — Telehealth: Payer: Self-pay | Admitting: Family Medicine

## 2012-03-06 MED ORDER — AMPHETAMINE-DEXTROAMPHET ER 20 MG PO CP24: 20.0000 mg | ORAL_CAPSULE | ORAL | Status: DC

## 2012-03-06 NOTE — Telephone Encounter (Signed)
Patient called stating that he need a refill of his adderall 20mg  1poqd. Please assist.

## 2012-03-06 NOTE — Telephone Encounter (Signed)
Pt was given Rx for Adderall #30 with 0 refills at [facility] 01-25-12.  Can we do a three Rx for pt this time.

## 2012-03-06 NOTE — Telephone Encounter (Signed)
Pt informed Rx ready to pick up

## 2012-03-06 NOTE — Telephone Encounter (Signed)
May refill for 3 months. Make sure this is Adderall XR 20 mg once daily

## 2012-06-24 ENCOUNTER — Telehealth: Payer: Self-pay | Admitting: Family Medicine

## 2012-06-24 NOTE — Telephone Encounter (Signed)
Patient called stating that he need a refill of his adderall xr 20 mg 1poqd. Please assist.

## 2012-06-25 MED ORDER — AMPHETAMINE-DEXTROAMPHET ER 20 MG PO CP24: 20.0000 mg | ORAL_CAPSULE | ORAL | Status: DC

## 2012-06-25 NOTE — Telephone Encounter (Signed)
Refill OK

## 2012-06-25 NOTE — Telephone Encounter (Signed)
Pt informed Rx ready to pick up

## 2012-06-25 NOTE — Telephone Encounter (Signed)
Pt last OV was 03-06-12 and adderall was filled X 3 at [facility] same day.

## 2012-07-25 ENCOUNTER — Encounter: Payer: Self-pay | Admitting: Gastroenterology

## 2012-08-25 ENCOUNTER — Ambulatory Visit: Payer: 59 | Admitting: Family Medicine

## 2012-08-25 ENCOUNTER — Telehealth: Payer: Self-pay | Admitting: Family Medicine

## 2012-08-25 NOTE — Telephone Encounter (Signed)
Patient would like to transfer to Dr. Beverely Low due to location. Is this okay? Thanks!

## 2012-08-25 NOTE — Telephone Encounter (Signed)
ok 

## 2012-08-25 NOTE — Telephone Encounter (Signed)
yes

## 2012-10-14 ENCOUNTER — Encounter: Payer: Self-pay | Admitting: Family Medicine

## 2012-10-14 ENCOUNTER — Ambulatory Visit (INDEPENDENT_AMBULATORY_CARE_PROVIDER_SITE_OTHER): Payer: 59 | Admitting: Family Medicine

## 2012-10-14 VITALS — BP 110/78 | HR 66 | Temp 98.1°F | Ht 68.75 in | Wt 163.2 lb

## 2012-10-14 DIAGNOSIS — F988 Other specified behavioral and emotional disorders with onset usually occurring in childhood and adolescence: Secondary | ICD-10-CM

## 2012-10-14 DIAGNOSIS — K200 Eosinophilic esophagitis: Secondary | ICD-10-CM | POA: Insufficient documentation

## 2012-10-14 MED ORDER — AMPHETAMINE-DEXTROAMPHET ER 20 MG PO CP24: 20.0000 mg | ORAL_CAPSULE | ORAL | Status: DC

## 2012-10-14 MED ORDER — CICLESONIDE 160 MCG/ACT IN AERS: INHALATION_SPRAY | RESPIRATORY_TRACT | Status: DC

## 2012-10-14 NOTE — Progress Notes (Signed)
  Subjective:    Patient ID: Albert Compton, male    DOB: 1975-12-22, 37 y.o.   MRN: [id number]  HPI New to establish.  Previous MD- [facility].  ADD- dx'd w/in the last year.  Started on Adderall XR w/ good improvement.  No regular palpitations- unless combined w/ Claritin D.  No insomnia.  Appetite is good- weight is stable.  Dysphagia- pt has had endoscopy 3-4x and was told that he has a food allergy that causes throat swelling and difficulty swallowing.  Has not seen allergy as planned.  Using Alvesco inhaler for esophagus rather than lungs- sprays into back of throat and swallows.     Review of Systems For ROS see HPI     Objective:   Physical Exam  Vitals reviewed. Constitutional: He is oriented to person, place, and time. He appears well-developed and well-nourished. No distress.  HENT:  Head: Normocephalic and atraumatic.  Eyes: Conjunctivae and EOM are normal. Pupils are equal, round, and reactive to light.  Neck: Normal range of motion. Neck supple. No thyromegaly present.  Cardiovascular: Normal rate, regular rhythm, normal heart sounds and intact distal pulses.   No murmur heard. Pulmonary/Chest: Effort normal and breath sounds normal. No respiratory distress.  Abdominal: Soft. Bowel sounds are normal. He exhibits no distension.  Musculoskeletal: He exhibits no edema.  Lymphadenopathy:    He has no cervical adenopathy.  Neurological: He is alert and oriented to person, place, and time. No cranial nerve deficit.  Skin: Skin is warm and dry.  Psychiatric: He has a normal mood and affect. His behavior is normal.          Assessment & Plan:

## 2012-10-14 NOTE — Assessment & Plan Note (Signed)
New to provider.  Ongoing for pt.  Reviewed controlled substance policy w/ pt.  Pt agreeable to sign and test.  Refills provided.  Will follow.

## 2012-10-14 NOTE — Patient Instructions (Addendum)
Schedule a complete physical in 6 months We'll call you with your allergy referral Call with any questions or concerns Welcome!  We're glad to have you!!

## 2012-10-14 NOTE — Assessment & Plan Note (Signed)
New to provider.  Has had multiple endoscopies.  Theory is that pt has unknown food allergy that is causing allergic inflammation.  Pt is using steroid inhaler to control sxs.  Reviewed this is not a good long term plan.  Will refer to allergist for appropriate testing and tx.  Pt expressed understanding and is in agreement w/ plan.

## 2012-10-20 ENCOUNTER — Telehealth: Payer: Self-pay | Admitting: *Deleted

## 2012-10-20 NOTE — Telephone Encounter (Signed)
[facility] is requesting prior Authorization for Alvesco 160 mcg I have attemped to obtain a authorization [facility] and was told that the patient needs try an alterative asthma medication such as Quvar, Asthmanex, Plumacort.  Before authorizing  Can be giving for Alvesco. Please Advise

## 2012-10-20 NOTE — Telephone Encounter (Signed)
Ok to switch to Qvar 80mg - 2 puffs twice daily.  Please notify pt of change in inhaler due to insurance reasons- no change in directions

## 2012-10-21 MED ORDER — BECLOMETHASONE DIPROPIONATE 80 MCG/ACT IN AERS: INHALATION_SPRAY | RESPIRATORY_TRACT | Status: DC

## 2012-10-21 NOTE — Telephone Encounter (Signed)
LVM for patient. Sent new Rx to pharmacy.

## 2012-10-31 ENCOUNTER — Encounter: Payer: Self-pay | Admitting: Family Medicine

## 2012-12-08 ENCOUNTER — Institutional Professional Consult (permissible substitution): Payer: 59 | Admitting: Internal Medicine

## 2013-01-07 ENCOUNTER — Ambulatory Visit (INDEPENDENT_AMBULATORY_CARE_PROVIDER_SITE_OTHER): Payer: 59 | Admitting: Family Medicine

## 2013-01-07 ENCOUNTER — Encounter: Payer: Self-pay | Admitting: Family Medicine

## 2013-01-07 VITALS — BP 112/78 | HR 77 | Temp 98.1°F | Resp 16 | Wt 157.4 lb

## 2013-01-07 DIAGNOSIS — F988 Other specified behavioral and emotional disorders with onset usually occurring in childhood and adolescence: Secondary | ICD-10-CM

## 2013-01-07 DIAGNOSIS — M7541 Impingement syndrome of right shoulder: Secondary | ICD-10-CM

## 2013-01-07 DIAGNOSIS — Z20818 Contact with and (suspected) exposure to other bacterial communicable diseases: Secondary | ICD-10-CM

## 2013-01-07 DIAGNOSIS — Z2089 Contact with and (suspected) exposure to other communicable diseases: Secondary | ICD-10-CM

## 2013-01-07 LAB — POCT RAPID STREP A (OFFICE): Rapid Strep A Screen: NEGATIVE

## 2013-01-07 MED ORDER — AMPHETAMINE-DEXTROAMPHET ER 20 MG PO CP24: 20.0000 mg | ORAL_CAPSULE | ORAL | Status: DC

## 2013-01-07 MED ORDER — NAPROXEN 500 MG PO TABS: 500.0000 mg | ORAL_TABLET | Freq: Two times a day (BID) | ORAL | Status: DC

## 2013-01-07 MED ORDER — CYCLOBENZAPRINE HCL 10 MG PO TABS: 10.0000 mg | ORAL_TABLET | Freq: Three times a day (TID) | ORAL | Status: DC | PRN

## 2013-01-07 NOTE — Patient Instructions (Signed)
We'll call you with your ortho appt Start the Naproxen twice daily- take w/ food- for inflammation Use the flexeril nightly for muscle spasm- will cause drowsiness HEAT! Call with any questions or concerns Hang in there!

## 2013-01-07 NOTE — Progress Notes (Signed)
  Subjective:    Patient ID: Albert Compton, male    DOB: Aug 16, 1975, 37 y.o.   MRN: [id number]  HPI ADD- pt requesting 3 month refill on meds  Shoulder pain- R shoulder, sxs started 15 yrs ago.  Has been 'really bad the last 2 weeks'.  Has been to chiropractic w/out relief.  Baseline ache and when severe, will cause numbness down the outside of arm.  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  Ibuprofen w/out relief.  Strep exposure- + sick contacts.  Had mild sore throat last night.  No fever.     Review of Systems For ROS see HPI     Objective:   Physical Exam  Vitals reviewed. Constitutional: He appears well-developed and well-nourished. No distress.  HENT:  Mouth/Throat: Oropharynx is clear and moist. No oropharyngeal exudate.  Neck: Neck supple.  Cardiovascular: Normal rate, regular rhythm, normal heart sounds and intact distal pulses.   Pulmonary/Chest: Effort normal and breath sounds normal. No respiratory distress. He has no wheezes. He has no rales.  Musculoskeletal:  R trap spasm + impingement signs of R shoulder Full ROM of R shoulder but pain w/ overhead or across body motions  Lymphadenopathy:    He has no cervical adenopathy.  Neurological: He has normal reflexes. Coordination normal.          Assessment & Plan:

## 2013-01-08 NOTE — Assessment & Plan Note (Signed)
Refill provided on pt's adderall for 3 months

## 2013-01-08 NOTE — Assessment & Plan Note (Addendum)
New.  Start scheduled NSAIDs, muscle relaxers, heat.  Due to chronicity, will refer to ortho.  Reviewed supportive care and red flags that should prompt return.  Pt expressed understanding and is in agreement w/ plan.

## 2013-01-08 NOTE — Assessment & Plan Note (Signed)
New.  PE WNL.  Rapid strep negative.  Will not confirm w/ culture given low likelihood.  Pt expressed understanding and is in agreement w/ plan.

## 2013-03-24 ENCOUNTER — Other Ambulatory Visit: Payer: Self-pay | Admitting: Family Medicine

## 2013-03-25 NOTE — Telephone Encounter (Signed)
Med filled.  

## 2013-03-30 ENCOUNTER — Ambulatory Visit (INDEPENDENT_AMBULATORY_CARE_PROVIDER_SITE_OTHER): Payer: 59 | Admitting: Family Medicine

## 2013-03-30 VITALS — BP 116/78 | HR 80 | Temp 97.8°F | Ht 70.0 in | Wt 168.2 lb

## 2013-03-30 DIAGNOSIS — Z01810 Encounter for preprocedural cardiovascular examination: Secondary | ICD-10-CM

## 2013-03-30 NOTE — Progress Notes (Signed)
   Subjective:    Albert Compton is a 38 y.o. male who presents to the office today for a preoperative consultation at the request of surgeon Dr Thomasena Edisollins who plans on performing R shoulder debridement on TBD  . This consultation is requested for the specific conditions prompting preoperative evaluation (i.e. because of potential affect on operative risk): esophageal stricture. Planned anesthesia: general. The patient has the following known anesthesia issues: none. Patients bleeding risk: no recent abnormal bleeding. Patient does not have objections to receiving blood products if needed.  The following portions of the patient's history were reviewed and updated as appropriate: allergies, current medications, past family history, past medical history, past social history, past surgical history and problem list.  Review of Systems A comprehensive review of systems was negative.    Objective:    BP 116/78  Pulse 80  Temp(Src) 97.8 F (36.6 C) (Oral)  Ht 5\' 10"  (1.778 m)  Wt 168 lb 3.2 oz (76.295 kg)  BMI 24.13 kg/m2  SpO2 99% General appearance: alert, cooperative, appears stated age and no distress Head: Normocephalic, without obvious abnormality, atraumatic Eyes: conjunctivae/corneas clear. PERRL, EOM's intact. Fundi benign. Ears: normal TM's and external ear canals both ears Nose: Nares normal. Septum midline. Mucosa normal. No drainage or sinus tenderness. Throat: lips, mucosa, and tongue normal; teeth and gums normal Neck: no adenopathy, supple, symmetrical, trachea midline and thyroid not enlarged, symmetric, no tenderness/mass/nodules Lungs: clear to auscultation bilaterally Chest wall: no tenderness Heart: regular rate and rhythm, S1, S2 normal, no murmur, click, rub or gallop Abdomen: soft, non-tender; bowel sounds normal; no masses,  no organomegaly  Predictors of intubation difficulty:  Morbid obesity? no  Anatomically abnormal facies? no  Prominent incisors? no  Receding  mandible? no  Short, thick neck? no  Neck range of motion: normal  Dentition: No chipped, loose, or missing teeth.  Cardiographics ECG: normal sinus rhythm, no blocks or conduction defects, no ischemic changes Echocardiogram: not done  Imaging Chest x-ray: NA   Lab Review  not applicable    Assessment:      38 y.o. male with planned surgery as above.   Known risk factors for perioperative complications: None   Difficulty with intubation is not anticipated.  Cardiac Risk Estimation: low  Current medications which may produce withdrawal symptoms if withheld perioperatively: none      Plan:    1. Preoperative workup as follows ECG. 2. Change in medication regimen before surgery: none, continue medication regimen including morning of surgery, with sip of water. 3. Prophylaxis for cardiac events with perioperative beta-blockers: not indicated. 4. Invasive hemodynamic monitoring perioperatively: at the discretion of anesthesiologist. 5. Deep vein thrombosis prophylaxis postoperatively:regimen to be chosen by surgical team. 6. Surveillance for postoperative MI with ECG immediately postoperatively and on postoperative days 1 and 2 AND troponin levels 24 hours postoperatively and on day 4 or hospital discharge (whichever comes first): at the discretion of anesthesiologist. 7. Other measures: none

## 2013-03-30 NOTE — Patient Instructions (Signed)
Follow up as needed Call if there are any changes to your medical history between now and surgery You look great!  Keep up the good work! Good luck w/ surgery!

## 2013-04-14 ENCOUNTER — Telehealth: Payer: Self-pay

## 2013-04-14 NOTE — Telephone Encounter (Signed)
Left message for call back Non identifiable  

## 2013-04-15 NOTE — Telephone Encounter (Signed)
Medication List and allergies:  Reviewed and updated  90 day supply/mail order: na Local [facility] [address] [place]  Immunizations due: flu deferred  A/P:   No changes to FH, PSH or Personal Hx Flu vaccine--deferred Tdap--03/2009  To Discuss with Provider: Not at this time

## 2013-04-16 ENCOUNTER — Encounter: Payer: Self-pay | Admitting: General Practice

## 2013-04-16 ENCOUNTER — Ambulatory Visit (INDEPENDENT_AMBULATORY_CARE_PROVIDER_SITE_OTHER): Payer: 59 | Admitting: Family Medicine

## 2013-04-16 ENCOUNTER — Other Ambulatory Visit: Payer: Self-pay | Admitting: General Practice

## 2013-04-16 ENCOUNTER — Encounter: Payer: Self-pay | Admitting: Family Medicine

## 2013-04-16 VITALS — BP 120/80 | HR 86 | Temp 98.2°F | Resp 16 | Ht 69.0 in | Wt 162.1 lb

## 2013-04-16 DIAGNOSIS — F988 Other specified behavioral and emotional disorders with onset usually occurring in childhood and adolescence: Secondary | ICD-10-CM

## 2013-04-16 DIAGNOSIS — Z0000 Encounter for general adult medical examination without abnormal findings: Secondary | ICD-10-CM | POA: Insufficient documentation

## 2013-04-16 LAB — LIPID PANEL
CHOLESTEROL: 159 mg/dL (ref 0–200)
HDL: 32.9 mg/dL — ABNORMAL LOW (ref >39.00)
LDL CALC: 117 mg/dL — AB (ref 0–99)
Total CHOL/HDL Ratio: 5
Triglycerides: 46 mg/dL (ref 0.0–149.0)
VLDL: 9.2 mg/dL (ref 0.0–40.0)

## 2013-04-16 LAB — CBC WITH DIFFERENTIAL/PLATELET
BASOS ABS: 0 10*3/uL (ref 0.0–0.1)
BASOS PCT: 0.5 % (ref 0.0–3.0)
EOS ABS: 0.4 10*3/uL (ref 0.0–0.7)
Eosinophils Relative: 5.1 % — ABNORMAL HIGH (ref 0.0–5.0)
HCT: 45.2 % (ref 39.0–52.0)
Hemoglobin: 15.5 g/dL (ref 13.0–17.0)
LYMPHS PCT: 32.2 % (ref 12.0–46.0)
Lymphs Abs: 2.4 10*3/uL (ref 0.7–4.0)
MCHC: 34.4 g/dL (ref 30.0–36.0)
MCV: 88.9 fl (ref 78.0–100.0)
Monocytes Absolute: 0.5 10*3/uL (ref 0.1–1.0)
Monocytes Relative: 6.3 % (ref 3.0–12.0)
NEUTROS PCT: 55.9 % (ref 43.0–77.0)
Neutro Abs: 4.3 10*3/uL (ref 1.4–7.7)
Platelets: 197 10*3/uL (ref 150.0–400.0)
RBC: 5.09 Mil/uL (ref 4.22–5.81)
RDW: 13 % (ref 11.5–14.6)
WBC: 7.6 10*3/uL (ref 4.5–10.5)

## 2013-04-16 LAB — BASIC METABOLIC PANEL
BUN: 16 mg/dL (ref 6–23)
CHLORIDE: 105 meq/L (ref 96–112)
CO2: 25 mEq/L (ref 19–32)
Calcium: 9 mg/dL (ref 8.4–10.5)
Creatinine, Ser: 1 mg/dL (ref 0.4–1.5)
GFR: 89.62 mL/min (ref >60.00)
Glucose, Bld: 83 mg/dL (ref 70–99)
POTASSIUM: 4.1 meq/L (ref 3.5–5.1)
Sodium: 138 mEq/L (ref 135–145)

## 2013-04-16 LAB — HEPATIC FUNCTION PANEL
ALK PHOS: 68 U/L (ref 39–117)
ALT: 24 U/L (ref 0–53)
AST: 17 U/L (ref 0–37)
Albumin: 4.2 g/dL (ref 3.5–5.2)
BILIRUBIN DIRECT: 0.1 mg/dL (ref 0.0–0.3)
Total Bilirubin: 0.7 mg/dL (ref 0.3–1.2)
Total Protein: 7.5 g/dL (ref 6.0–8.3)

## 2013-04-16 LAB — TSH: TSH: 0.88 u[IU]/mL (ref 0.35–5.50)

## 2013-04-16 MED ORDER — AMPHETAMINE-DEXTROAMPHET ER 20 MG PO CP24: 20.0000 mg | ORAL_CAPSULE | ORAL | Status: DC

## 2013-04-16 MED ORDER — NAPROXEN 500 MG PO TABS: 500.0000 mg | ORAL_TABLET | Freq: Two times a day (BID) | ORAL | Status: AC

## 2013-04-16 NOTE — Patient Instructions (Signed)
[facility] in 6 months [facility] results and make any changes if needed Call with any questions or concerns Keep up the good work!

## 2013-04-16 NOTE — Telephone Encounter (Signed)
Med filled.  

## 2013-04-16 NOTE — Assessment & Plan Note (Signed)
Pt's PE WNL.  Check labs.  Anticipatory guidance provided.  

## 2013-04-16 NOTE — Progress Notes (Signed)
   Subjective:    Patient ID: Albert Compton, male    DOB: September 08, 1975, 38 y.o.   MRN: [id number]  HPI CPE- no concerns today.  Needs refill on Adderall.   Review of Systems Patient reports no vision/hearing changes, anorexia, fever ,adenopathy, persistant/recurrent hoarseness, swallowing issues, chest pain, palpitations, edema, persistant/recurrent cough, hemoptysis, dyspnea (rest,exertional, paroxysmal nocturnal), gastrointestinal  bleeding (melena, rectal bleeding), abdominal pain, excessive heart burn, GU symptoms (dysuria, hematuria, voiding/incontinence issues) syncope, focal weakness, memory loss, numbness & tingling, skin/hair/nail changes, depression, anxiety, abnormal bruising/bleeding    Objective:   Physical Exam BP 120/80  Pulse 86  Temp(Src) 98.2 F (36.8 C) (Oral)  Resp 16  Ht 5\' 9"  (1.753 m)  Wt 162 lb 2 oz (73.539 kg)  BMI 23.93 kg/m2  SpO2 98%  General Appearance:    Alert, cooperative, no distress, appears stated age  Head:    Normocephalic, without obvious abnormality, atraumatic  Eyes:    PERRL, conjunctiva/corneas clear, EOM's intact, fundi    benign, both eyes       Ears:    Normal TM's and external ear canals, both ears  Nose:   Nares normal, septum midline, mucosa normal, no drainage   or sinus tenderness  Throat:   Lips, mucosa, and tongue normal; teeth and gums normal  Neck:   Supple, symmetrical, trachea midline, no adenopathy;       thyroid:  No enlargement/tenderness/nodules  Back:     Symmetric, no curvature, ROM normal, no CVA tenderness  Lungs:     Clear to auscultation bilaterally, respirations unlabored  Chest wall:    No tenderness or deformity  Heart:    Regular rate and rhythm, S1 and S2 normal, no murmur, rub   or gallop  Abdomen:     Soft, non-tender, bowel sounds active all four quadrants,    no masses, no organomegaly  Genitalia:    Normal male without lesion, discharge or tenderness  Rectal:    Deferred due to young age  Extremities:    Extremities normal, atraumatic, no cyanosis or edema  Pulses:   2+ and symmetric all extremities  Skin:   Skin color, texture, turgor normal, no rashes or lesions  Lymph nodes:   Cervical, supraclavicular, and axillary nodes normal  Neurologic:   CNII-XII intact. Normal strength, sensation and reflexes      throughout          Assessment & Plan:

## 2013-04-16 NOTE — Progress Notes (Signed)
Pre visit review using our clinic review tool, if applicable. No additional management support is needed unless otherwise documented below in the visit note. 

## 2013-08-14 ENCOUNTER — Telehealth: Payer: Self-pay | Admitting: Family Medicine

## 2013-08-14 DIAGNOSIS — F988 Other specified behavioral and emotional disorders with onset usually occurring in childhood and adolescence: Secondary | ICD-10-CM

## 2013-08-14 MED ORDER — AMPHETAMINE-DEXTROAMPHET ER 20 MG PO CP24: 20.0000 mg | ORAL_CAPSULE | ORAL | Status: DC

## 2013-08-14 NOTE — Telephone Encounter (Signed)
Caller name: Dez Relation to pt: Call back number: [phone]   Reason for call:  Pt needs RX amphetamine-dextroamphetamine (ADDERALL XR) 20 MG 24 hr filled

## 2013-08-14 NOTE — Telephone Encounter (Signed)
Med filled per protocol.    

## 2013-09-08 ENCOUNTER — Other Ambulatory Visit: Payer: Self-pay | Admitting: General Practice

## 2013-09-08 ENCOUNTER — Ambulatory Visit (INDEPENDENT_AMBULATORY_CARE_PROVIDER_SITE_OTHER): Payer: 59 | Admitting: Family Medicine

## 2013-09-08 ENCOUNTER — Encounter: Payer: Self-pay | Admitting: Family Medicine

## 2013-09-08 VITALS — BP 112/78 | HR 87 | Temp 98.2°F | Resp 16 | Wt 171.4 lb

## 2013-09-08 DIAGNOSIS — F988 Other specified behavioral and emotional disorders with onset usually occurring in childhood and adolescence: Secondary | ICD-10-CM

## 2013-09-08 DIAGNOSIS — F418 Other specified anxiety disorders: Secondary | ICD-10-CM

## 2013-09-08 DIAGNOSIS — F341 Dysthymic disorder: Secondary | ICD-10-CM

## 2013-09-08 MED ORDER — FLUOXETINE HCL 20 MG PO TABS: 20.0000 mg | ORAL_TABLET | Freq: Every day | ORAL | Status: DC

## 2013-09-08 MED ORDER — AMPHETAMINE-DEXTROAMPHET ER 20 MG PO CP24: 20.0000 mg | ORAL_CAPSULE | ORAL | Status: DC

## 2013-09-08 NOTE — Progress Notes (Signed)
   Subjective:    Patient ID: Albert Compton, male    DOB: 1975/10/09, 38 y.o.   MRN: [id number]  HPI ADD- chronic problem, on Adderall w/ good symptom control during the week.  Does not take medication on weekend and will develop depressed feelings.  Anxiety/depression- chronic problem, pt is now in marriage counseling w/ wife and counselor recommended treatment due to pt's irritability.  Wife describes the days that he's not on Adderall as a 'dark, dark hole'.  'do you have something to make me less of an ass?'   Review of Systems For ROS see HPI     Objective:   Physical Exam  Vitals reviewed. Constitutional: He is oriented to person, place, and time. He appears well-developed and well-nourished. No distress.  HENT:  Head: Normocephalic and atraumatic.  Cardiovascular: Normal rate, regular rhythm, normal heart sounds and intact distal pulses.   Pulmonary/Chest: Effort normal and breath sounds normal. No respiratory distress. He has no wheezes. He has no rales.  Neurological: He is alert and oriented to person, place, and time. No cranial nerve deficit. Coordination normal.  Skin: Skin is warm and dry.  Psychiatric: He has a normal mood and affect. His behavior is normal. Judgment and thought content normal.          Assessment & Plan:

## 2013-09-08 NOTE — Progress Notes (Signed)
Pre visit review using our clinic review tool, if applicable. No additional management support is needed unless otherwise documented below in the visit note. 

## 2013-09-08 NOTE — Patient Instructions (Signed)
Follow up in 4-6 weeks to recheck mood Start the prozac daily for the mood Continue the Adderall for attention Call with any questions or concerns Hang in there!!  You can do this!

## 2013-09-08 NOTE — Assessment & Plan Note (Signed)
Chronic problem, continue Adderall as directed.  Refill provided.

## 2013-09-08 NOTE — Assessment & Plan Note (Signed)
New.  Discussed dx and tx plan w/ both pt and wife.  Recommended daily SSRI w/ continued monitoring for symptom improvement.  Applauded him for attending counseling.  Will follow closely.

## 2013-10-14 ENCOUNTER — Ambulatory Visit: Payer: 59 | Admitting: Family Medicine

## 2013-10-15 ENCOUNTER — Ambulatory Visit (INDEPENDENT_AMBULATORY_CARE_PROVIDER_SITE_OTHER): Payer: 59 | Admitting: Family Medicine

## 2013-10-15 ENCOUNTER — Encounter: Payer: Self-pay | Admitting: Family Medicine

## 2013-10-15 VITALS — BP 112/80 | HR 74 | Temp 98.2°F | Resp 16 | Wt 165.4 lb

## 2013-10-15 DIAGNOSIS — F341 Dysthymic disorder: Secondary | ICD-10-CM

## 2013-10-15 DIAGNOSIS — F418 Other specified anxiety disorders: Secondary | ICD-10-CM

## 2013-10-15 DIAGNOSIS — IMO0001 Reserved for inherently not codable concepts without codable children: Secondary | ICD-10-CM | POA: Insufficient documentation

## 2013-10-15 DIAGNOSIS — L03019 Cellulitis of unspecified finger: Secondary | ICD-10-CM

## 2013-10-15 DIAGNOSIS — L03011 Cellulitis of right finger: Secondary | ICD-10-CM

## 2013-10-15 MED ORDER — AMOXICILLIN-POT CLAVULANATE 875-125 MG PO TABS: 1.0000 | ORAL_TABLET | Freq: Two times a day (BID) | ORAL | Status: DC

## 2013-10-15 MED ORDER — FLUOXETINE HCL 40 MG PO CAPS: 40.0000 mg | ORAL_CAPSULE | Freq: Every day | ORAL | Status: DC

## 2013-10-15 NOTE — Progress Notes (Signed)
Pre visit review using our clinic review tool, if applicable. No additional management support is needed unless otherwise documented below in the visit note. 

## 2013-10-15 NOTE — Patient Instructions (Signed)
Follow up as needed Increase the Prozac to 40mg - 2 of what you have at home and 1 of the new prescription Start the Augmentin twice daily (w/ food) for the nail infection We'll call you with your Derm appt Call with any questions or concerns HAPPY BIRTHDAY!!!

## 2013-10-15 NOTE — Progress Notes (Signed)
   Subjective:    Patient ID: Lyman BishopEric Dokken, male    DOB: Sep 16, 1975, 38 y.o.   MRN: [id number]  HPI Depression/Anxiety- improved since starting Prozac.  Family relationships have improved.  Work is easier to handle.  Pt is interested in increasing dose to see if we can get additional improvement.   R 4th finger w/ ingrown nail and infection.  + redness, swelling, TTP   Review of Systems For ROS see HPI     Objective:   Physical Exam  Vitals reviewed. Constitutional: He is oriented to person, place, and time. He appears well-developed and well-nourished. No distress.  HENT:  Head: Normocephalic and atraumatic.  Neurological: He is alert and oriented to person, place, and time.  Skin: Skin is warm and dry. There is erythema (R 4th finger w/ erythema, fluctuance, no drainage but ingrown nail edge).  Psychiatric: He has a normal mood and affect. His behavior is normal. Thought content normal.          Assessment & Plan:

## 2013-10-16 ENCOUNTER — Encounter: Payer: Self-pay | Admitting: Family Medicine

## 2013-10-16 NOTE — Assessment & Plan Note (Signed)
Improved since starting Prozac at last visit.  Pt interested in increasing meds- will increase to 40mg  and monitor for improvement.  Pt expressed understanding and is in agreement w/ plan.

## 2013-10-16 NOTE — Assessment & Plan Note (Signed)
New.  Start abx.  Refer to derm for treatment of ingrown nail.

## 2013-10-28 ENCOUNTER — Other Ambulatory Visit: Payer: Self-pay | Admitting: Family Medicine

## 2013-10-28 ENCOUNTER — Encounter: Payer: Self-pay | Admitting: Family Medicine

## 2013-10-28 MED ORDER — FLUOXETINE HCL 20 MG PO TABS: 20.0000 mg | ORAL_TABLET | Freq: Every day | ORAL | Status: DC

## 2013-12-15 ENCOUNTER — Telehealth: Payer: Self-pay

## 2013-12-15 DIAGNOSIS — F988 Other specified behavioral and emotional disorders with onset usually occurring in childhood and adolescence: Secondary | ICD-10-CM

## 2013-12-15 NOTE — Telephone Encounter (Signed)
Last OV 10-15-13 Med filled 09-08-13 3 months at a time.   Please advise.

## 2013-12-15 NOTE — Telephone Encounter (Signed)
Dirk Dress Wife [phone] [facility] [phone] Kendra Opitz called to get refills for amphetamine-dextroamphetamine (ADDERALL XR) 20 MG 24 hr capsule, he is completely out.

## 2013-12-16 MED ORDER — AMPHETAMINE-DEXTROAMPHET ER 20 MG PO CP24: 20.0000 mg | ORAL_CAPSULE | ORAL | Status: DC

## 2013-12-16 NOTE — Telephone Encounter (Signed)
Med filled and pt notified.  

## 2013-12-16 NOTE — Telephone Encounter (Signed)
Ok to refill x 3 months??       

## 2014-02-02 ENCOUNTER — Other Ambulatory Visit: Payer: Self-pay | Admitting: General Practice

## 2014-02-02 MED ORDER — FLUOXETINE HCL 20 MG PO TABS: 20.0000 mg | ORAL_TABLET | Freq: Every day | ORAL | Status: DC

## 2014-03-17 ENCOUNTER — Ambulatory Visit: Payer: 59 | Admitting: Family Medicine

## 2014-03-25 ENCOUNTER — Other Ambulatory Visit: Payer: Self-pay | Admitting: Family Medicine

## 2014-03-25 DIAGNOSIS — F988 Other specified behavioral and emotional disorders with onset usually occurring in childhood and adolescence: Secondary | ICD-10-CM

## 2014-03-29 MED ORDER — AMPHETAMINE-DEXTROAMPHET ER 20 MG PO CP24: 20.0000 mg | ORAL_CAPSULE | ORAL | Status: DC

## 2014-03-29 NOTE — Telephone Encounter (Signed)
Last OV 10-15-13 adderall last filled 12-16-13 #90 with 0

## 2014-03-29 NOTE — Telephone Encounter (Signed)
mychart notifying pt.

## 2014-04-05 ENCOUNTER — Ambulatory Visit (INDEPENDENT_AMBULATORY_CARE_PROVIDER_SITE_OTHER): Payer: BLUE CROSS/BLUE SHIELD | Admitting: Medical

## 2014-04-05 ENCOUNTER — Encounter: Payer: Self-pay | Admitting: Medical

## 2014-04-05 VITALS — BP 127/85 | HR 79 | Temp 97.7°F | Ht 69.5 in | Wt 162.8 lb

## 2014-04-05 DIAGNOSIS — H109 Unspecified conjunctivitis: Secondary | ICD-10-CM | POA: Insufficient documentation

## 2014-04-05 MED ORDER — BECLOMETHASONE DIPROPIONATE 80 MCG/ACT IN AERS: INHALATION_SPRAY | RESPIRATORY_TRACT | Status: DC

## 2014-04-05 MED ORDER — TOBRAMYCIN 0.3 % OP SOLN: 2.0000 [drp] | Freq: Four times a day (QID) | OPHTHALMIC | Status: DC

## 2014-04-05 NOTE — Progress Notes (Signed)
Subjective:    Patient ID: Albert Compton, male    DOB: 01-Oct-1975, 39 y.o.   MRN: [id number]  HPI   Pt has lt  eye irritation for2 days. No eye trauma or foreign body injury hx that he knows of. But he thinks maybe got dust in his yes walking into gym. Eye is mild irritated with redness. No  matting in am.  No known exposure to person with conjunctivitis. No blurred vision. No rash or blister outbreak around eyes reported.  No pressure sensation to eye and no light flashing. No visual changes.  Children are uri illness. Drainage form noses but no pink eye.    Past Medical History  Diagnosis Date  . Chicken pox   . Diverticulitis   . GERD (gastroesophageal reflux disease)   . Allergy   . History of kidney stones   . Depression   . Candidal esophagitis     Recurrent    History   Social History  . Marital Status: Married    Spouse Name: N/A    Number of Children: 4  . Years of Education: N/A   Occupational History  . [employer]    Social History Main Topics  . Smoking status: Never Smoker   . Smokeless tobacco: Never Used  . Alcohol Use: No  . Drug Use: No  . Sexual Activity: Not on file   Other Topics Concern  . Not on file   Social History Narrative   Daily caffeine     Past Surgical History  Procedure Laterality Date  . Hernia repair  2012    umbilical  . Vasectomy      Family History  Problem Relation Age of Onset  . Alcohol abuse Mother   . Hypertension Mother   . Alcohol abuse Father   . Mental illness Father   . Alcohol abuse Sister   . Mental illness Sister   . Alcohol abuse Maternal Aunt   . Alcohol abuse Maternal Uncle   . Alcohol abuse Paternal Aunt   . Alcohol abuse Paternal Uncle   . Colon cancer Neg Hx     No Known Allergies  Current Outpatient Prescriptions on File Prior to Visit  Medication Sig Dispense Refill  . amphetamine-dextroamphetamine (ADDERALL XR) 20 MG 24 hr capsule Take 1 capsule (20 mg total) by mouth  every morning. 30 capsule 0  . azelastine (ASTELIN) 137 MCG/SPRAY nasal spray Place 1 spray into the nose 2 (two) times daily. Use in each nostril as directed 30 mL 11  . beclomethasone (QVAR) 80 MCG/ACT inhaler Take 2 puffs twice daily 3 Inhaler 3  . cyclobenzaprine (FLEXERIL) 10 MG tablet TAKE 1 TABLET (10 MG TOTAL) BY MOUTH 3 (THREE) TIMES DAILY AS NEEDED FOR MUSCLE SPASMS. 30 tablet 0  . FLUoxetine (PROZAC) 20 MG tablet Take 1 tablet (20 mg total) by mouth daily. 30 tablet 3  . loratadine-pseudoephedrine (CLARITIN-D 24-HOUR) 10-240 MG per 24 hr tablet Take 1 tablet by mouth as needed.     . methocarbamol (ROBAXIN) 500 MG tablet     . naproxen (NAPROSYN) 500 MG tablet Take 1 tablet (500 mg total) by mouth 2 (two) times daily with a meal. 60 tablet 0  . Pseudoephedrine-Naproxen Na (ALEVE-D SINUS & COLD PO) Take by mouth as needed.     . VOLTAREN 1 % GEL     . amoxicillin-clavulanate (AUGMENTIN) 875-125 MG per tablet Take 1 tablet by mouth 2 (two) times daily. (Patient not taking: Reported  on 04/05/2014) 20 tablet 0   No current facility-administered medications on file prior to visit.    BP 127/85 mmHg  Pulse 79  Temp(Src) 97.7 F (36.5 C) (Oral)  Ht 5' 9.5" (1.765 m)  Wt 162 lb 12.8 oz (73.846 kg)  BMI 23.70 kg/m2  SpO2 99%        Review of Systems  Constitutional: Negative for chills, fatigue and unexpected weight change.  HENT: Negative for dental problem, ear discharge, ear pain, postnasal drip, rhinorrhea, sneezing and sore throat.   Eyes: Positive for redness and itching. Negative for photophobia and visual disturbance.  Respiratory: Negative for cough, chest tightness, shortness of breath and wheezing.   Cardiovascular: Negative for chest pain and palpitations.  Gastrointestinal: Negative for nausea, vomiting, abdominal pain, diarrhea and constipation.  Musculoskeletal: Negative for back pain.  Hematological: Negative for adenopathy. Does not bruise/bleed easily.        Objective:   Physical Exam   General- No acute distress. Pleasant patient. Neck- Full range of motion, no jvd Lungs- Clear, even and unlabored. Heart- regular rate and rhythm. Neurologic- CNII- XII grossly intact. Eye exam- Lt eye mild diffuse injected/red conjutivitis. On woods light. No abrasion or ulcer. No pick up.  Rt eye normal.         Assessment & Plan:

## 2014-04-05 NOTE — Assessment & Plan Note (Signed)
You appear to have bacterial conjunctivitis. I am prescribing  tobrex antibiotic eye drops. Your eye should gradually improve. If your eye symptoms worsen, persist or change please notify us.   Follow up in 7 days or as needed.  By wed am if you feel not any better let me know and would try to refer you to optometrist.

## 2014-04-05 NOTE — Patient Instructions (Addendum)
You appear to have bacterial conjunctivitis. I am prescribing  tobrex antibiotic eye drops. Your eye should gradually improve. If your eye symptoms worsen, persist or change please notify us.   Follow up in 7 days or as needed.  By wed am if you feel not any better let me know and would try to refer you to optometrist. 

## 2014-04-05 NOTE — Progress Notes (Signed)
Pre visit review using our clinic review tool, if applicable. No additional management support is needed unless otherwise documented below in the visit note. 

## 2014-04-08 ENCOUNTER — Telehealth: Payer: Self-pay | Admitting: Family Medicine

## 2014-04-08 MED ORDER — MOXIFLOXACIN HCL 0.5 % OP SOLN: 1.0000 [drp] | Freq: Three times a day (TID) | OPHTHALMIC | Status: DC

## 2014-04-08 NOTE — Telephone Encounter (Signed)
Caller name: Ceasar Relation to pt: self Call back number: [phone] Pharmacy: [facility] on [address]  Reason for call:   Patient states that his eye infection is not any better and would like vigamox called in.

## 2014-04-08 NOTE — Telephone Encounter (Signed)
Pt eye is the same. Red and irritated. But no severe pain, no pressure, no light flashing, no vision changes. He took tobrex but did not help. He wants vigamox called in. I thought in light of virtually same and not worsening that I would send in rx with provision that he agrees to be seen on Monday here if not better and if he worsens any over the weekend then he is to go to [facility]. Pt agreed with the plan.

## 2014-05-03 ENCOUNTER — Encounter: Payer: Self-pay | Admitting: Family Medicine

## 2014-05-03 ENCOUNTER — Other Ambulatory Visit: Payer: Self-pay | Admitting: Family Medicine

## 2014-05-03 MED ORDER — AMPHETAMINE-DEXTROAMPHET ER 20 MG PO CP24: 20.0000 mg | ORAL_CAPSULE | ORAL | Status: DC

## 2014-05-03 MED ORDER — FLUOXETINE HCL 20 MG PO TABS: 20.0000 mg | ORAL_TABLET | Freq: Every day | ORAL | Status: DC

## 2014-05-03 NOTE — Telephone Encounter (Signed)
Last OV 10-15-13 adderall last filled 09-08-13 #30 with o

## 2014-05-03 NOTE — Telephone Encounter (Signed)
Med filled will notify pt.  

## 2014-05-03 NOTE — Telephone Encounter (Signed)
Fluoxetine refilled.

## 2014-05-24 ENCOUNTER — Ambulatory Visit (HOSPITAL_BASED_OUTPATIENT_CLINIC_OR_DEPARTMENT_OTHER)
Admission: RE | Admit: 2014-05-24 | Discharge: 2014-05-24 | Disposition: A | Discharge status: Home / Self Care | Payer: BLUE CROSS/BLUE SHIELD | Source: Ambulatory Visit | Attending: Family Medicine | Admitting: Family Medicine

## 2014-05-24 ENCOUNTER — Ambulatory Visit (INDEPENDENT_AMBULATORY_CARE_PROVIDER_SITE_OTHER): Payer: BLUE CROSS/BLUE SHIELD | Admitting: Family Medicine

## 2014-05-24 ENCOUNTER — Encounter: Payer: Self-pay | Admitting: Family Medicine

## 2014-05-24 VITALS — BP 122/80 | HR 80 | Temp 98.1°F | Resp 16 | Wt 164.5 lb

## 2014-05-24 DIAGNOSIS — R229 Localized swelling, mass and lump, unspecified: Secondary | ICD-10-CM | POA: Insufficient documentation

## 2014-05-24 DIAGNOSIS — M7989 Other specified soft tissue disorders: Secondary | ICD-10-CM

## 2014-05-24 DIAGNOSIS — F909 Attention-deficit hyperactivity disorder, unspecified type: Secondary | ICD-10-CM

## 2014-05-24 DIAGNOSIS — M799 Soft tissue disorder, unspecified: Secondary | ICD-10-CM

## 2014-05-24 DIAGNOSIS — F988 Other specified behavioral and emotional disorders with onset usually occurring in childhood and adolescence: Secondary | ICD-10-CM

## 2014-05-24 MED ORDER — AMPHETAMINE-DEXTROAMPHET ER 20 MG PO CP24
20.0000 mg | ORAL_CAPSULE | ORAL | Status: DC
Start: 2014-05-24 — End: 2014-08-19

## 2014-05-24 MED ORDER — AMPHETAMINE-DEXTROAMPHET ER 20 MG PO CP24: 20.0000 mg | ORAL_CAPSULE | ORAL | Status: DC

## 2014-05-24 NOTE — Progress Notes (Signed)
Pre visit review using our clinic review tool, if applicable. No additional management support is needed unless otherwise documented below in the visit note. 

## 2014-05-24 NOTE — Progress Notes (Signed)
   Subjective:    Patient ID: Albert Compton, male    DOB: 1976-02-12, 39 y.o.   MRN: [id number]  HPI Mass- 'lump on my back'.  Wife found it last week.  Was not tender until wife poked at it.  Not enlarging.  No drainage.  No redness.  No similar lumps elsewhere.   Review of Systems For ROS see HPI     Objective:   Physical Exam  Constitutional: He is oriented to person, place, and time. He appears well-developed and well-nourished. No distress.  HENT:  Head: Normocephalic and atraumatic.  Neurological: He is alert and oriented to person, place, and time.  Skin: Skin is warm and dry.  3-4 cm subcutaneous soft tissue mass on R lateral back just below rib, no TTP  Psychiatric: He has a normal mood and affect. His behavior is normal. Thought content normal.  Vitals reviewed.         Assessment & Plan:

## 2014-05-24 NOTE — Patient Instructions (Signed)
Follow up as scheduled We'll call you with your US appt Call with any questions or concerns Happy Spring!!

## 2014-05-24 NOTE — Assessment & Plan Note (Signed)
Chronic problem.  Pt given 3 months of refills.

## 2014-05-24 NOTE — Assessment & Plan Note (Signed)
New.  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  Get US to assess as pt and wife are concerned about possible cancer.  Will follow.

## 2014-08-19 ENCOUNTER — Telehealth: Payer: Self-pay | Admitting: *Deleted

## 2014-08-19 ENCOUNTER — Encounter: Payer: Self-pay | Admitting: *Deleted

## 2014-08-19 NOTE — Telephone Encounter (Signed)
Pre-Visit Call completed with patient and chart updated.   Pre-Visit Info documented in Specialty Comments under [employer].    

## 2014-08-20 ENCOUNTER — Encounter: Payer: Self-pay | Admitting: Family Medicine

## 2014-08-20 ENCOUNTER — Ambulatory Visit (INDEPENDENT_AMBULATORY_CARE_PROVIDER_SITE_OTHER): Payer: BLUE CROSS/BLUE SHIELD | Admitting: Family Medicine

## 2014-08-20 VITALS — BP 120/78 | HR 85 | Temp 98.0°F | Resp 16 | Ht 70.0 in | Wt 165.1 lb

## 2014-08-20 DIAGNOSIS — F909 Attention-deficit hyperactivity disorder, unspecified type: Secondary | ICD-10-CM | POA: Diagnosis not present

## 2014-08-20 DIAGNOSIS — Z0000 Encounter for general adult medical examination without abnormal findings: Secondary | ICD-10-CM | POA: Diagnosis not present

## 2014-08-20 DIAGNOSIS — F988 Other specified behavioral and emotional disorders with onset usually occurring in childhood and adolescence: Secondary | ICD-10-CM

## 2014-08-20 MED ORDER — BECLOMETHASONE DIPROPIONATE 80 MCG/ACT IN AERS: INHALATION_SPRAY | RESPIRATORY_TRACT | Status: DC

## 2014-08-20 MED ORDER — AMPHETAMINE-DEXTROAMPHET ER 20 MG PO CP24: 20.0000 mg | ORAL_CAPSULE | ORAL | Status: DC

## 2014-08-20 MED ORDER — DICLOFENAC SODIUM 1 % TD GEL: 4.0000 g | Freq: Four times a day (QID) | TRANSDERMAL | Status: DC | PRN

## 2014-08-20 MED ORDER — AZELASTINE HCL 0.1 % NA SOLN: 1.0000 | Freq: Two times a day (BID) | NASAL | Status: AC | PRN

## 2014-08-20 MED ORDER — FLUOXETINE HCL 20 MG PO TABS: 20.0000 mg | ORAL_TABLET | Freq: Every day | ORAL | Status: DC

## 2014-08-20 NOTE — Assessment & Plan Note (Signed)
Refill provided

## 2014-08-20 NOTE — Progress Notes (Signed)
Pre visit review using our clinic review tool, if applicable. No additional management support is needed unless otherwise documented below in the visit note. 

## 2014-08-20 NOTE — Progress Notes (Signed)
   Subjective:    Patient ID: Albert BishopEric Compton, male    DOB: 1975-06-19, 39 y.o.   MRN: [id number]  HPI CPE- no concerns today.   Review of Systems Patient reports no vision/hearing changes, anorexia, fever ,adenopathy, persistant/recurrent hoarseness, swallowing issues, chest pain, palpitations, edema, persistant/recurrent cough, hemoptysis, dyspnea (rest,exertional, paroxysmal nocturnal), gastrointestinal  bleeding (melena, rectal bleeding), abdominal pain, excessive heart burn, GU symptoms (dysuria, hematuria, voiding/incontinence issues) syncope, focal weakness, memory loss, numbness & tingling, skin/hair/nail changes, depression, anxiety, abnormal bruising/bleeding, musculoskeletal symptoms/signs.     Objective:   Physical Exam General Appearance:    Alert, cooperative, no distress, appears stated age  Head:    Normocephalic, without obvious abnormality, atraumatic  Eyes:    PERRL, conjunctiva/corneas clear, EOM's intact, fundi    benign, both eyes       Ears:    Normal TM's and external ear canals, both ears  Nose:   Nares normal, septum midline, mucosa normal, no drainage   or sinus tenderness  Throat:   Lips, mucosa, and tongue normal; teeth and gums normal  Neck:   Supple, symmetrical, trachea midline, no adenopathy;       thyroid:  No enlargement/tenderness/nodules  Back:     Symmetric, no curvature, ROM normal, no CVA tenderness  Lungs:     Clear to auscultation bilaterally, respirations unlabored  Chest wall:    No tenderness or deformity  Heart:    Regular rate and rhythm, S1 and S2 normal, no murmur, rub   or gallop  Abdomen:     Soft, non-tender, bowel sounds active all four quadrants,    no masses, no organomegaly  Genitalia:    Normal male without lesion, masses,discharge or tenderness  Rectal:    Deferred due to young age  Extremities:   Extremities normal, atraumatic, no cyanosis or edema  Pulses:   2+ and symmetric all extremities  Skin:   Skin color, texture, turgor  normal, no rashes or lesions  Lymph nodes:   Cervical, supraclavicular, and axillary nodes normal  Neurologic:   CNII-XII intact. Normal strength, sensation and reflexes      throughout          Assessment & Plan:

## 2014-08-20 NOTE — Patient Instructions (Signed)
Follow up in 1 year or as needed We'll notify you of your lab results and make any changes if needed Keep up the good work on healthy diet and regular exercise- you look great! Call with any questions or concerns Have a great weekend! 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

## 2014-08-20 NOTE — Assessment & Plan Note (Signed)
Pt's PE WNL.  Check labs.  Anticipatory guidance provided.  

## 2014-08-25 ENCOUNTER — Telehealth: Payer: Self-pay | Admitting: General Practice

## 2014-08-25 NOTE — Telephone Encounter (Signed)
-----   Message from Sheliah HatchKatherine E Tabori, MD sent at 08/25/2014  8:07 AM EDT ----- Please have pt return for labs  Thanks! ----- Message -----    From: SYSTEM    Sent: 08/25/2014  12:04 AM      To: Sheliah HatchKatherine E Tabori, MD

## 2014-08-25 NOTE — Telephone Encounter (Signed)
Called pt and LMOVM to inform that he needed to call the office to schedule a fasting lab appt due to non of his CPE labs were drawn.

## 2014-08-31 ENCOUNTER — Other Ambulatory Visit: Payer: Self-pay | Admitting: Family Medicine

## 2014-08-31 ENCOUNTER — Ambulatory Visit (HOSPITAL_BASED_OUTPATIENT_CLINIC_OR_DEPARTMENT_OTHER)
Admission: RE | Admit: 2014-08-31 | Discharge: 2014-08-31 | Disposition: A | Discharge status: Home / Self Care | Payer: BLUE CROSS/BLUE SHIELD | Source: Ambulatory Visit | Attending: Family Medicine | Admitting: Family Medicine

## 2014-08-31 ENCOUNTER — Ambulatory Visit (INDEPENDENT_AMBULATORY_CARE_PROVIDER_SITE_OTHER): Payer: BLUE CROSS/BLUE SHIELD | Admitting: Family Medicine

## 2014-08-31 ENCOUNTER — Encounter: Payer: Self-pay | Admitting: Family Medicine

## 2014-08-31 VITALS — BP 122/82 | HR 86 | Temp 98.0°F | Resp 16 | Wt 163.4 lb

## 2014-08-31 DIAGNOSIS — R61 Generalized hyperhidrosis: Secondary | ICD-10-CM | POA: Diagnosis not present

## 2014-08-31 DIAGNOSIS — R1909 Other intra-abdominal and pelvic swelling, mass and lump: Secondary | ICD-10-CM | POA: Insufficient documentation

## 2014-08-31 DIAGNOSIS — I889 Nonspecific lymphadenitis, unspecified: Secondary | ICD-10-CM

## 2014-08-31 DIAGNOSIS — N508 Other specified disorders of male genital organs: Secondary | ICD-10-CM

## 2014-08-31 DIAGNOSIS — IMO0002 Reserved for concepts with insufficient information to code with codable children: Secondary | ICD-10-CM

## 2014-08-31 LAB — CBC WITH DIFFERENTIAL/PLATELET
Basophils Absolute: 0 10*3/uL (ref 0.0–0.1)
Basophils Relative: 0.4 % (ref 0.0–3.0)
Eosinophils Absolute: 0.3 10*3/uL (ref 0.0–0.7)
Eosinophils Relative: 3.4 % (ref 0.0–5.0)
HCT: 50 % (ref 39.0–52.0)
Hemoglobin: 16.9 g/dL (ref 13.0–17.0)
LYMPHS ABS: 2.1 10*3/uL (ref 0.7–4.0)
Lymphocytes Relative: 22.1 % (ref 12.0–46.0)
MCHC: 33.8 g/dL (ref 30.0–36.0)
MCV: 89.9 fl (ref 78.0–100.0)
Monocytes Absolute: 0.5 10*3/uL (ref 0.1–1.0)
Monocytes Relative: 5.7 % (ref 3.0–12.0)
NEUTROS PCT: 68.4 % (ref 43.0–77.0)
Neutro Abs: 6.5 10*3/uL (ref 1.4–7.7)
Platelets: 209 10*3/uL (ref 150.0–400.0)
RBC: 5.56 Mil/uL (ref 4.22–5.81)
RDW: 13.1 % (ref 11.5–15.5)
WBC: 9.5 10*3/uL (ref 4.0–10.5)

## 2014-08-31 LAB — HEPATIC FUNCTION PANEL
ALT: 22 U/L (ref 0–53)
AST: 15 U/L (ref 0–37)
Albumin: 4.2 g/dL (ref 3.5–5.2)
Alkaline Phosphatase: 74 U/L (ref 39–117)
BILIRUBIN DIRECT: 0.1 mg/dL (ref 0.0–0.3)
BILIRUBIN TOTAL: 0.6 mg/dL (ref 0.2–1.2)
Total Protein: 7.2 g/dL (ref 6.0–8.3)

## 2014-08-31 LAB — LIPID PANEL
Cholesterol: 187 mg/dL (ref 0–200)
HDL: 30.6 mg/dL — ABNORMAL LOW (ref >39.00)
LDL Cholesterol: 132 mg/dL — ABNORMAL HIGH (ref 0–99)
NONHDL: 156.4
Total CHOL/HDL Ratio: 6
Triglycerides: 124 mg/dL (ref 0.0–149.0)
VLDL: 24.8 mg/dL (ref 0.0–40.0)

## 2014-08-31 LAB — BASIC METABOLIC PANEL
BUN: 10 mg/dL (ref 6–23)
CO2: 30 mEq/L (ref 19–32)
Calcium: 9.3 mg/dL (ref 8.4–10.5)
Chloride: 102 mEq/L (ref 96–112)
Creatinine, Ser: 0.93 mg/dL (ref 0.40–1.50)
GFR: 96.72 mL/min (ref >60.00)
GLUCOSE: 106 mg/dL — AB (ref 70–99)
Potassium: 4.1 mEq/L (ref 3.5–5.1)
Sodium: 136 mEq/L (ref 135–145)

## 2014-08-31 LAB — TSH: TSH: 1.49 u[IU]/mL (ref 0.35–4.50)

## 2014-08-31 MED ORDER — MELOXICAM 15 MG PO TABS: 15.0000 mg | ORAL_TABLET | Freq: Every day | ORAL | Status: DC

## 2014-08-31 NOTE — Patient Instructions (Signed)
We'll notify you of your lab results and imaging results as soon as possible Go downstairs and get your chest xray before you leave Start the mobic once daily for pain and inflammation Call with any questions or concerns Hang in there!  We'll figure this out!

## 2014-08-31 NOTE — Progress Notes (Signed)
Pre visit review using our clinic review tool, if applicable. No additional management support is needed unless otherwise documented below in the visit note. 

## 2014-08-31 NOTE — Progress Notes (Signed)
   Subjective:    Patient ID: Albert BishopEric Jupin, male    DOB: 09-11-1975, 39 y.o.   MRN: [id number]  HPI Groin pain- sxs started Sunday.  R sided.  Yesterday developed L sided groin pain along w/ testicular soreness.  Painful to walk.  No known injury.  Pt noted hard, painful lump in R groin when leg was crossed.  Has been having night sweats.  No new lumps or bumps in testicle.  No burning w/ urination.   Review of Systems For ROS see HPI     Objective:   Physical Exam  Constitutional: He is oriented to person, place, and time. He appears well-developed and well-nourished. No distress.  HENT:  Head: Normocephalic and atraumatic.  Neck: Normal range of motion. Neck supple. No thyromegaly present.  Cardiovascular: Normal rate, regular rhythm, normal heart sounds and intact distal pulses.   Pulmonary/Chest: Effort normal and breath sounds normal. No respiratory distress. He has no wheezes. He has no rales.  Abdominal: Soft. He exhibits no distension. There is no tenderness. There is no rebound and no guarding.  Musculoskeletal: He exhibits no edema.  Lymphadenopathy:    He has no cervical adenopathy.    He has no axillary adenopathy.       Right: Inguinal (TTP) adenopathy present. No supraclavicular and no epitrochlear adenopathy present.       Left: Inguinal adenopathy present. No supraclavicular and no epitrochlear adenopathy present.  Neurological: He is alert and oriented to person, place, and time.  Skin: Skin is warm and dry.  Psychiatric: He has a normal mood and affect. His behavior is normal. Thought content normal.  Vitals reviewed.         Assessment & Plan:

## 2014-09-05 NOTE — Assessment & Plan Note (Signed)
New.  Get [place] to assess given pt's recent night sweats.  Get CBC.  Pt comes from area w/ high prevalence of lymphoma due to environmental exposures.  Will follow very closely.

## 2014-09-05 NOTE — Assessment & Plan Note (Signed)
New.  Get US to assess. 

## 2014-09-05 NOTE — Assessment & Plan Note (Signed)
New.  Check CBC.  Get CXR.  If labs and CXR normal, but no improvement in sxs, will refer to hematology or ID.  Pt expressed understanding and is in agreement w/ plan.

## 2014-09-13 ENCOUNTER — Encounter: Payer: Self-pay | Admitting: Family Medicine

## 2014-09-14 NOTE — Telephone Encounter (Signed)
It really is hard for me to say since his labs looked good and the [place] was negative.  Would recommend repeat assessment at some point this week unless he wants to wait for Dr. Beverely Low to return and give input.

## 2014-09-16 ENCOUNTER — Other Ambulatory Visit: Payer: Self-pay | Admitting: Family Medicine

## 2014-09-16 DIAGNOSIS — R61 Generalized hyperhidrosis: Secondary | ICD-10-CM

## 2014-09-16 DIAGNOSIS — I889 Nonspecific lymphadenitis, unspecified: Secondary | ICD-10-CM

## 2014-10-06 ENCOUNTER — Encounter: Payer: Self-pay | Admitting: Family Medicine

## 2014-10-06 DIAGNOSIS — F988 Other specified behavioral and emotional disorders with onset usually occurring in childhood and adolescence: Secondary | ICD-10-CM

## 2014-10-06 MED ORDER — DICLOFENAC SODIUM 1 % TD GEL: 4.0000 g | Freq: Four times a day (QID) | TRANSDERMAL | Status: AC | PRN

## 2014-10-06 MED ORDER — AMPHETAMINE-DEXTROAMPHET ER 20 MG PO CP24: 20.0000 mg | ORAL_CAPSULE | ORAL | Status: DC

## 2014-10-06 NOTE — Telephone Encounter (Signed)
Last OV 08-31-14 adderall last filled 08-20-14 #30 with 0  Ok to fill voltaren? last filled 08-20-14 100g with 1

## 2014-10-06 NOTE — Telephone Encounter (Signed)
Med filled.  

## 2014-11-07 ENCOUNTER — Encounter: Payer: Self-pay | Admitting: Family Medicine

## 2014-11-07 DIAGNOSIS — F988 Other specified behavioral and emotional disorders with onset usually occurring in childhood and adolescence: Secondary | ICD-10-CM

## 2014-11-08 MED ORDER — AMPHETAMINE-DEXTROAMPHET ER 20 MG PO CP24: 20.0000 mg | ORAL_CAPSULE | ORAL | Status: DC

## 2014-11-08 NOTE — Telephone Encounter (Signed)
Pt notified rx available at front desk for pick up.  

## 2014-11-08 NOTE — Telephone Encounter (Signed)
Last OV 08/31/14 adderall last filled 10/06/14 #30 with 0

## 2014-12-09 ENCOUNTER — Encounter: Payer: Self-pay | Admitting: Family Medicine

## 2014-12-09 DIAGNOSIS — F988 Other specified behavioral and emotional disorders with onset usually occurring in childhood and adolescence: Secondary | ICD-10-CM

## 2014-12-09 MED ORDER — AMPHETAMINE-DEXTROAMPHET ER 20 MG PO CP24: 20.0000 mg | ORAL_CAPSULE | ORAL | Status: DC

## 2014-12-09 NOTE — Telephone Encounter (Signed)
Pt informed rx availabale at front desk for pick up.

## 2014-12-09 NOTE — Telephone Encounter (Signed)
Last OV 08/31/14 adderall last filled 11/08/14 #30 with 0

## 2015-01-14 ENCOUNTER — Other Ambulatory Visit: Payer: Self-pay | Admitting: Family Medicine

## 2015-01-14 DIAGNOSIS — F988 Other specified behavioral and emotional disorders with onset usually occurring in childhood and adolescence: Secondary | ICD-10-CM

## 2015-01-15 ENCOUNTER — Other Ambulatory Visit: Payer: Self-pay | Admitting: Family Medicine

## 2015-01-17 MED ORDER — AMPHETAMINE-DEXTROAMPHET ER 20 MG PO CP24: 20.0000 mg | ORAL_CAPSULE | ORAL | Status: DC

## 2015-01-17 NOTE — Telephone Encounter (Signed)
Medication filled to pharmacy as requested.   

## 2015-02-23 ENCOUNTER — Other Ambulatory Visit: Payer: Self-pay | Admitting: Physician Assistant

## 2015-02-23 DIAGNOSIS — F988 Other specified behavioral and emotional disorders with onset usually occurring in childhood and adolescence: Secondary | ICD-10-CM

## 2015-02-24 MED ORDER — AMPHETAMINE-DEXTROAMPHET ER 20 MG PO CP24: 20.0000 mg | ORAL_CAPSULE | ORAL | Status: AC

## 2015-02-24 NOTE — Telephone Encounter (Signed)
Last ov 08/31/14 adderall last filled 01/17/15 #30 with 0

## 2015-02-24 NOTE — Telephone Encounter (Signed)
Med filled and pt aware at front desk for pick up.  

## 2015-06-07 ENCOUNTER — Other Ambulatory Visit: Payer: Self-pay | Admitting: Family Medicine

## 2015-06-07 NOTE — Telephone Encounter (Signed)
Medication filled to pharmacy as requested.   

## 2015-08-05 ENCOUNTER — Other Ambulatory Visit: Payer: Self-pay | Admitting: Gastroenterology

## 2015-08-05 DIAGNOSIS — R1310 Dysphagia, unspecified: Secondary | ICD-10-CM

## 2015-08-11 ENCOUNTER — Ambulatory Visit: Payer: BLUE CROSS/BLUE SHIELD

## 2015-08-17 ENCOUNTER — Ambulatory Visit
Admission: RE | Admit: 2015-08-17 | Discharge: 2015-08-17 | Disposition: A | Discharge status: Home / Self Care | Payer: BLUE CROSS/BLUE SHIELD | Source: Ambulatory Visit | Attending: Gastroenterology | Admitting: Gastroenterology

## 2015-08-17 DIAGNOSIS — K222 Esophageal obstruction: Secondary | ICD-10-CM | POA: Insufficient documentation

## 2015-08-17 DIAGNOSIS — K200 Eosinophilic esophagitis: Secondary | ICD-10-CM | POA: Diagnosis not present

## 2015-08-17 DIAGNOSIS — R1310 Dysphagia, unspecified: Secondary | ICD-10-CM | POA: Insufficient documentation

## 2015-08-31 ENCOUNTER — Ambulatory Visit
Admission: RE | Admit: 2015-08-31 | Discharge: 2015-08-31 | Disposition: A | Discharge status: Home / Self Care | Payer: BLUE CROSS/BLUE SHIELD | Source: Ambulatory Visit | Attending: Gastroenterology | Admitting: Gastroenterology

## 2015-08-31 ENCOUNTER — Encounter
Admission: RE | Disposition: A | Discharge status: Home / Self Care | Payer: Self-pay | Source: Ambulatory Visit | Attending: Gastroenterology

## 2015-08-31 ENCOUNTER — Ambulatory Visit: Payer: BLUE CROSS/BLUE SHIELD | Admitting: Anesthesiology

## 2015-08-31 DIAGNOSIS — K200 Eosinophilic esophagitis: Secondary | ICD-10-CM | POA: Insufficient documentation

## 2015-08-31 DIAGNOSIS — K222 Esophageal obstruction: Secondary | ICD-10-CM | POA: Diagnosis not present

## 2015-08-31 DIAGNOSIS — Z8619 Personal history of other infectious and parasitic diseases: Secondary | ICD-10-CM | POA: Diagnosis not present

## 2015-08-31 DIAGNOSIS — Z79899 Other long term (current) drug therapy: Secondary | ICD-10-CM | POA: Insufficient documentation

## 2015-08-31 DIAGNOSIS — F329 Major depressive disorder, single episode, unspecified: Secondary | ICD-10-CM | POA: Insufficient documentation

## 2015-08-31 DIAGNOSIS — K219 Gastro-esophageal reflux disease without esophagitis: Secondary | ICD-10-CM | POA: Insufficient documentation

## 2015-08-31 DIAGNOSIS — R1310 Dysphagia, unspecified: Secondary | ICD-10-CM | POA: Insufficient documentation

## 2015-08-31 DIAGNOSIS — Z791 Long term (current) use of non-steroidal anti-inflammatories (NSAID): Secondary | ICD-10-CM | POA: Diagnosis not present

## 2015-08-31 DIAGNOSIS — Z87442 Personal history of urinary calculi: Secondary | ICD-10-CM | POA: Diagnosis not present

## 2015-08-31 HISTORY — PX: ESOPHAGOGASTRODUODENOSCOPY (EGD) WITH PROPOFOL: SHX5813

## 2015-08-31 SURGERY — ESOPHAGOGASTRODUODENOSCOPY (EGD) WITH PROPOFOL
Anesthesia: General

## 2015-08-31 MED ORDER — LIDOCAINE 2% (20 MG/ML) 5 ML SYRINGE
INTRAMUSCULAR | Status: DC | PRN
  Administered 2015-08-31: 40 mg via INTRAVENOUS

## 2015-08-31 MED ORDER — PROPOFOL 10 MG/ML IV BOLUS
INTRAVENOUS | Status: DC | PRN
  Administered 2015-08-31: 50 mg via INTRAVENOUS
  Administered 2015-08-31: 100 mg via INTRAVENOUS

## 2015-08-31 MED ORDER — SODIUM CHLORIDE 0.9 % IV SOLN: INTRAVENOUS | Status: DC

## 2015-08-31 MED ORDER — SODIUM CHLORIDE 0.9 % IV SOLN
INTRAVENOUS | Status: DC | PRN
  Administered 2015-08-31: 13:00:00 via INTRAVENOUS
  Administered 2015-08-31: 1000 mL

## 2015-08-31 MED ORDER — FENTANYL CITRATE (PF) 100 MCG/2ML IJ SOLN
INTRAMUSCULAR | Status: DC | PRN
  Administered 2015-08-31 (×2): 50 ug via INTRAVENOUS

## 2015-08-31 MED ORDER — PHENYLEPHRINE HCL 10 MG/ML IJ SOLN
INTRAMUSCULAR | Status: DC | PRN
  Administered 2015-08-31: 100 ug via INTRAVENOUS

## 2015-08-31 MED ORDER — MIDAZOLAM HCL 5 MG/5ML IJ SOLN
INTRAMUSCULAR | Status: DC | PRN
  Administered 2015-08-31: 1 mg via INTRAVENOUS

## 2015-08-31 MED ORDER — PROPOFOL 500 MG/50ML IV EMUL
INTRAVENOUS | Status: DC | PRN
  Administered 2015-08-31: 180 ug/kg/min via INTRAVENOUS

## 2015-08-31 NOTE — H&P (Signed)
Outpatient short stay form Pre-procedure 08/31/2015 1:21 PM Christena Deem MD  Primary Physician: Dr. Wendie Simmer  Reason for visit:  EGD  History of present illness:  Patient is a 40 year old male presenting today for EGD in regards to a previous personal history of eosinophilic esophagitis. This was apparently diagnosed in about 2012. He has had several esophageal dilatations however prior to coming back in the office be seen about a month ago he was not on a proton pump inhibitor. He has been now on that for a month and states that he is doing better. He does have item stick in the retrosternal region. He has had a barium swallow that indicates a narrowing that catches a tablet at the GE junction are slightly above. There is some minimal rippling effect of the esophagus.  He denies use of any aspirin products or blood thinning agents.  Current facility-administered medications:  .  0.9 %  sodium chloride infusion, , Intravenous, Continuous, Christena Deem, MD  Facility-Administered Medications Ordered in Other Encounters:  .  0.9 %  sodium chloride infusion, , , Continuous PRN, Charna Busman, CRNA, 1,000 mL at 08/31/15 1313  Prescriptions prior to admission  Medication Sig Dispense Refill Last Dose  . amphetamine-dextroamphetamine (ADDERALL XR) 20 MG 24 hr capsule Take 1 capsule (20 mg total) by mouth every morning. 30 capsule 0   . azelastine (ASTELIN) 0.1 % nasal spray Place 1 spray into both nostrils 2 (two) times daily as needed for rhinitis. Use in each nostril as directed 30 mL 6 Taking  . beclomethasone (QVAR) 80 MCG/ACT inhaler Take 2 puffs twice daily 3 Inhaler 6 Taking  . cyclobenzaprine (FLEXERIL) 10 MG tablet TAKE 1 TABLET (10 MG TOTAL) BY MOUTH 3 (THREE) TIMES DAILY AS NEEDED FOR MUSCLE SPASMS. 30 tablet 0 Taking  . diclofenac sodium (VOLTAREN) 1 % GEL Apply 4 g topically 4 (four) times daily as needed. 100 g 1   . FLUoxetine (PROZAC) 20 MG tablet TAKE 1 TABLET (20 MG  TOTAL) BY MOUTH DAILY. 30 tablet 3   . loratadine-pseudoephedrine (CLARITIN-D 24-HOUR) 10-240 MG per 24 hr tablet Take 1 tablet by mouth as needed.    Taking  . meloxicam (MOBIC) 15 MG tablet Take 1 tablet (15 mg total) by mouth daily. 30 tablet 1   . methocarbamol (ROBAXIN) 500 MG tablet    Taking  . moxifloxacin (VIGAMOX) 0.5 % ophthalmic solution Place 1 drop into the left eye 3 (three) times daily. 3 mL 0 Taking  . Pseudoephedrine-Naproxen Na (ALEVE-D SINUS & COLD PO) Take by mouth as needed.    Taking     No Known Allergies   Past Medical History  Diagnosis Date  . Chicken pox   . Diverticulitis   . GERD (gastroesophageal reflux disease)   . Allergy   . History of kidney stones   . Depression   . Candidal esophagitis (HCC)     Recurrent    Review of systems:      Physical Exam    Heart and lungs: Regular rate and rhythm without rub or gallop, lungs are bilaterally clear.    HEENT: Normocephalic atraumatic eyes are anicteric    Other:     Pertinant exam for procedure: Soft nontender nondistended bowel sounds positive normoactive.    Planned proceedures: EGD and indicated procedures. I did indicate to patient that I may require assistance from one of my colleagues. I have discussed the risks benefits and complications of procedures to include not  limited to bleeding, infection, perforation and the risk of sedation and the patient wishes to proceed.    Christena DeemMartin U Coleta Grosshans, MD Gastroenterology 08/31/2015  1:21 PM

## 2015-08-31 NOTE — Transfer of Care (Signed)
Immediate Anesthesia Transfer of Care Note  Patient: Albert Compton  Procedure(s) Performed: Procedure(s): ESOPHAGOGASTRODUODENOSCOPY (EGD) WITH PROPOFOL (N/A)  Patient Location: PACU and Endoscopy Unit  Anesthesia Type:General  Level of Consciousness: sedated  Airway & Oxygen Therapy: Patient Spontanous Breathing and Patient connected to nasal cannula oxygen  Post-op Assessment: Report given to RN and Post -op Vital signs reviewed and stable  Post vital signs: Reviewed and stable  Last Vitals:  Filed Vitals:   08/31/15 1302  BP: 117/86  Pulse: 68  Temp: 35.8 C  Resp: 18    Last Pain: There were no vitals filed for this visit.       Complications: No apparent anesthesia complications

## 2015-08-31 NOTE — Anesthesia Preprocedure Evaluation (Signed)
Anesthesia Evaluation  Patient identified by MRN, date of birth, ID band Patient awake    Reviewed: Allergy & Precautions, H&P , NPO status , Patient's Chart, lab work & pertinent test results, reviewed documented beta blocker date and time   History of Anesthesia Complications Negative for: history of anesthetic complications  Airway Mallampati: I  TM Distance: >3 FB Neck ROM: full    Dental no notable dental hx. (+) Chipped, Teeth Intact   Pulmonary neg pulmonary ROS,    Pulmonary exam normal breath sounds clear to auscultation       Cardiovascular Exercise Tolerance: Good negative cardio ROS Normal cardiovascular exam Rhythm:regular Rate:Normal     Neuro/Psych PSYCHIATRIC DISORDERS (Depression and anxiety) negative neurological ROS     GI/Hepatic Neg liver ROS, GERD  ,  Endo/Other  negative endocrine ROS  Renal/GU Renal disease (kidney stones)  negative genitourinary   Musculoskeletal   Abdominal   Peds  Hematology negative hematology ROS (+)   Anesthesia Other Findings Past Medical History:   Chicken pox                                                  Diverticulitis                                               GERD (gastroesophageal reflux disease)                       Allergy                                                      History of kidney stones                                     Depression                                                   Candidal esophagitis (HCC)                                     Comment:Recurrent   Reproductive/Obstetrics negative OB ROS                             Anesthesia Physical Anesthesia Plan  ASA: II  Anesthesia Plan: General   Post-op Pain Management:    Induction:   Airway Management Planned:   Additional Equipment:   Intra-op Plan:   Post-operative Plan:   Informed Consent: I have reviewed the patients History and  Physical, chart, labs and discussed the procedure including the risks, benefits and alternatives for the proposed anesthesia with the patient or authorized representative who has indicated his/her understanding and acceptance.  Dental Advisory Given  Plan Discussed with: Anesthesiologist, CRNA and Surgeon  Anesthesia Plan Comments:         Anesthesia Quick Evaluation

## 2015-08-31 NOTE — Op Note (Signed)
[facility] Gastroenterology Patient Name: Albert Compton Dry Procedure Date: 08/31/2015 1:24 PM MRN: [id number] Account #: [id number] Date of Birth: 03/13/1976 Admit Type: Outpatient Age: 40 [facility] 1 Gender: Male Note Status: Finalized Procedure:            Upper GI endoscopy Indications:          Dysphagia Providers:            Christena DeemMartin U. Skulskie, MD Referring MD:         Melanee SpryVicki S. Ether GriffinsFowler (Referring MD) Medicines:            Monitored Anesthesia Care Complications:        No immediate complications. Procedure:            Pre-Anesthesia Assessment:                       - ASA Grade Assessment: II - A patient with mild                        systemic disease.                       After obtaining informed consent, the endoscope was                        passed under direct vision. Throughout the procedure,                        the patient's blood pressure, pulse, and oxygen                        saturations were monitored continuously. The Endoscope                        was introduced through the mouth, and advanced to the                        lower third of esophagus. The upper GI endoscopy was                        accomplished without difficulty. The patient tolerated                        the procedure well. Findings:      Mucosal changes including ringed esophagus and stenosis were found in       the middle third of the esophagus and in the lower third of the       esophagus. Esophageal findings were graded using the Eosinophilic       Esophagitis Endoscopic Reference Score (EoE-EREFS) as: Edema Grade 0       Normal (distinct vascular markings), Rings Grade 1 Mild (subtle       circumferential ridges seen on esophageal distension), Exudates Grade 0       None (no white lesions seen), Furrows Grade 0 None (no vertical lines       seen) and Stricture present (8 mm luminal diameter). The scope was       advanced to the GE junction. The GIF 190  would not go through the ring       noted at the GE junction, however there was a large amount of food in  the gasatric vault. This precluded the dilation of the ring for concern       of aspiration. Biopsies were taken of the ring at the GE junction and at       about 28 cm from the incisors for histology. Impression:           - Esophageal mucosal changes suggestive of eosinophilic                        esophagitis. Recommendation:       - Discharge patient to home.                       - Continue present medications.                       - Await pathology results.                       - Do a gastric emptying study at appointment to be                        scheduled. Procedure Code(s):    --- Professional ---                       43200, Esophagoscopy, flexible, transoral; diagnostic,                        including collection of specimen(s) by brushing or                        washing, when performed (separate procedure) Diagnosis Code(s):    --- Professional ---                       K20.9, Esophagitis, unspecified                       R13.10, Dysphagia, unspecified CPT copyright 2016 American Medical Association. All rights reserved. The codes documented in this report are preliminary and upon coder review may  be revised to meet current compliance requirements. Christena Deem, MD 08/31/2015 1:51:05 PM This report has been signed electronically. Number of Addenda: 0 Note Initiated On: 08/31/2015 1:24 PM      [facility]

## 2015-09-01 ENCOUNTER — Encounter: Payer: Self-pay | Admitting: Gastroenterology

## 2015-09-02 LAB — SURGICAL PATHOLOGY

## 2015-09-02 NOTE — Anesthesia Postprocedure Evaluation (Signed)
Anesthesia Post Note  Patient: Albert Compton  Procedure(s) Performed: Procedure(s) (LRB): ESOPHAGOGASTRODUODENOSCOPY (EGD) WITH PROPOFOL (N/A)  Patient location during evaluation: Endoscopy Anesthesia Type: General Level of consciousness: awake and alert Pain management: pain level controlled Vital Signs Assessment: post-procedure vital signs reviewed and stable Respiratory status: spontaneous breathing, nonlabored ventilation, respiratory function stable and patient connected to nasal cannula oxygen Cardiovascular status: blood pressure returned to baseline and stable Postop Assessment: no signs of nausea or vomiting Anesthetic complications: no    Last Vitals:  Filed Vitals:   08/31/15 1424 08/31/15 1434  BP: 112/83 100/69  Pulse: 67 61  Temp:    Resp: 14 19    Last Pain:  Filed Vitals:   09/01/15 0804  PainSc: 0-No pain                 Lenard SimmerAndrew Nikoloz Huy

## 2015-09-05 ENCOUNTER — Other Ambulatory Visit: Payer: Self-pay | Admitting: Gastroenterology

## 2015-09-05 DIAGNOSIS — K209 Esophagitis, unspecified without bleeding: Secondary | ICD-10-CM

## 2015-09-28 ENCOUNTER — Ambulatory Visit: Payer: BLUE CROSS/BLUE SHIELD | Attending: Gastroenterology

## 2017-11-16 IMAGING — RF DG ESOPHAGUS
11 series · 14 of 14 positions shown · non-contrast
Comparison: None in PACs

CLINICAL DATA: History of eosinophilic esophagitis and previous
episodes of esophageal dilation.

EXAM:
ESOPHOGRAM / BARIUM SWALLOW / BARIUM TABLET STUDY
TECHNIQUE: Combined double contrast and single contrast examination performed
using effervescent crystals, thick barium liquid, and thin barium
liquid. The patient was observed with fluoroscopy swallowing a 13 mm
barium sulphate tablet.
FLUOROSCOPY TIME:  Fluoroscopy Time:  0 minutes, 54 seconds
Number of Acquired Images:  10+ 1 video sequence

[Series 1: fluoro_barium 2fps_bw · 0.18mm/px · 1 of 1 slices shown (1 of 11)]
[im 1/1]
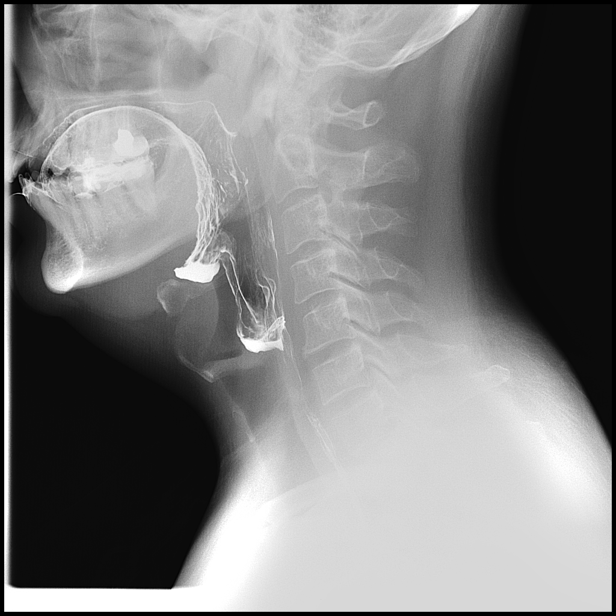

[Series 2: fluoro_barium 2fps_bw · 0.18mm/px · 1 of 1 slices shown (2 of 11)]
[im 1/1]
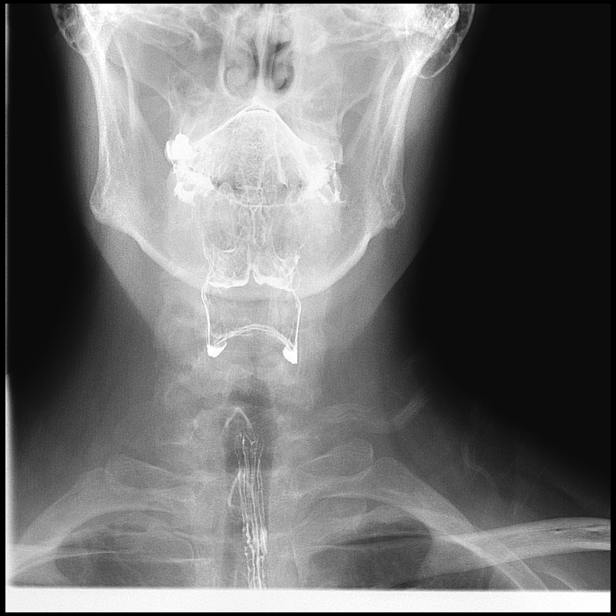

[Series 3: fluoro_barium 2fps_bw · 0.18mm/px · 1 of 1 slices shown (3 of 11)]
[im 1/1]
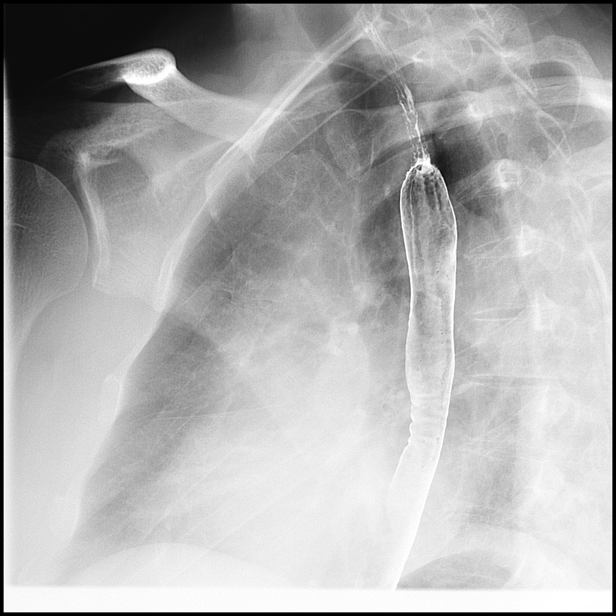

[Series 4: fluoro_barium 2fps_bw · 0.18mm/px · 1 of 1 slices shown (4 of 11)]
[im 1/1]
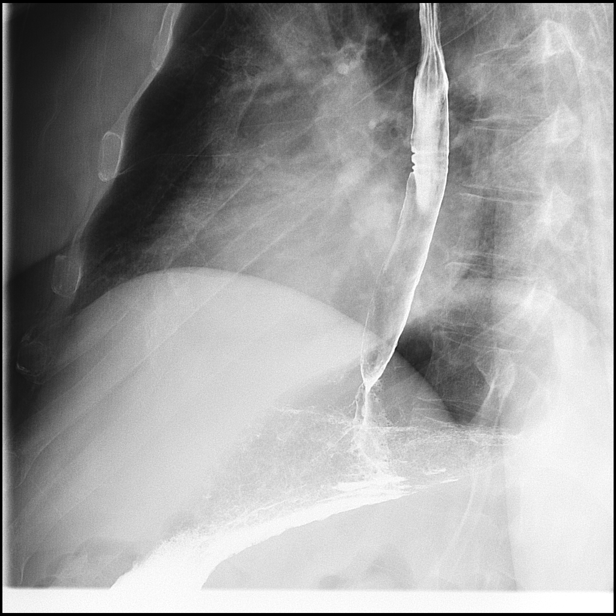

[Series 5: fluoro_barium 2fps_bw · 0.18mm/px · 1 of 1 slices shown (5 of 11)]
[im 1/1]
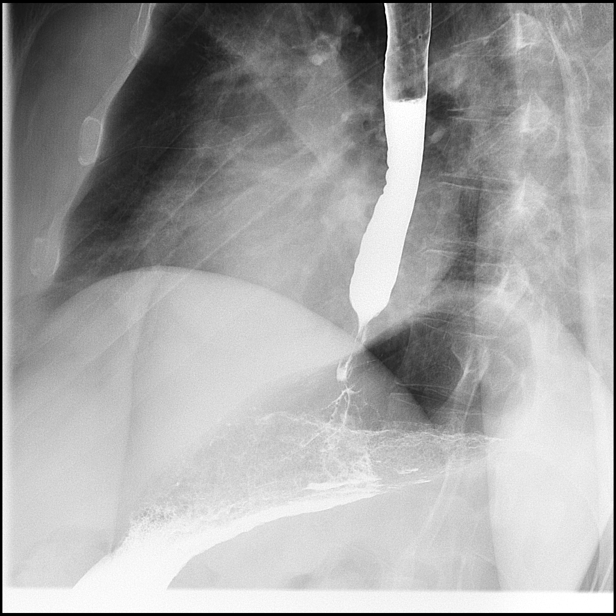

[Series 6: fluoro_barium 2fps_bw · 0.18mm/px · 1 of 1 slices shown (6 of 11)]
[im 1/1]
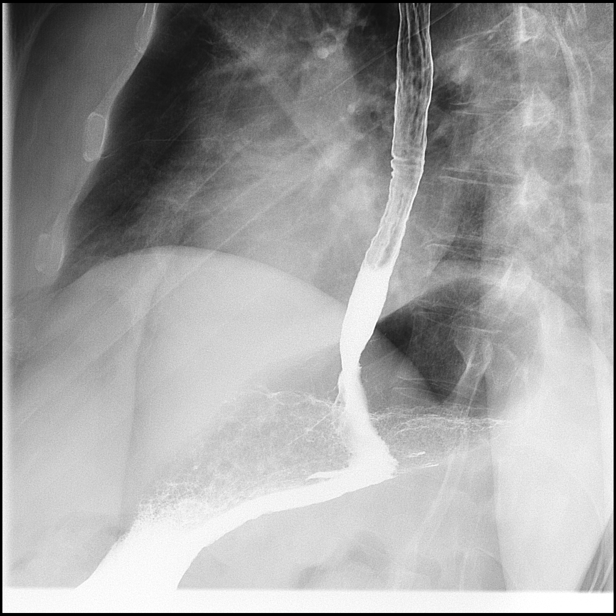

[Series 7: fluoro_barium 2fps_bw · 0.18mm/px · 1 of 1 slices shown (7 of 11)]
[im 1/1]
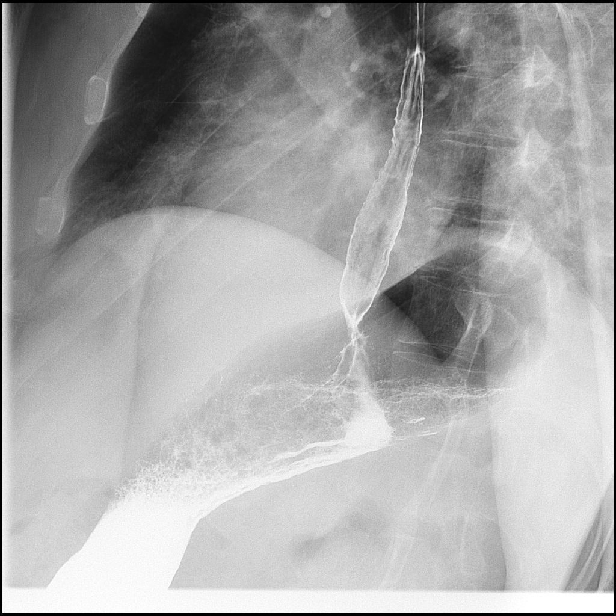

[Series 8: fluoro_barium 2fps_bw · 0.18mm/px · 4 of 19 frames shown (8 of 11)]
[frame 1/19]
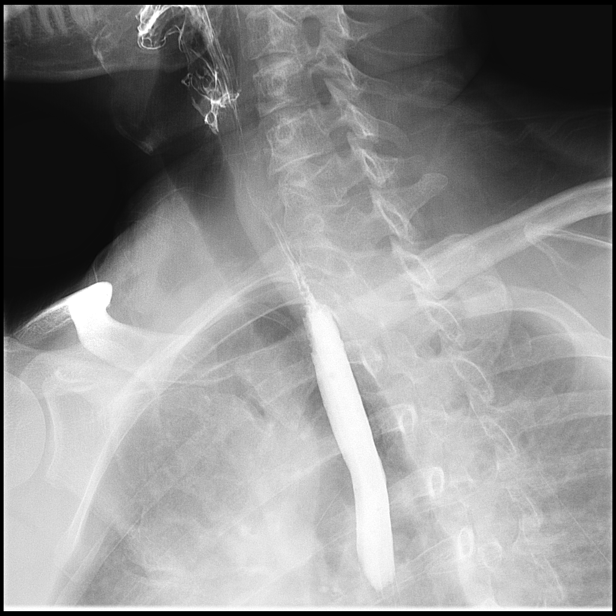
[frame 3/19]
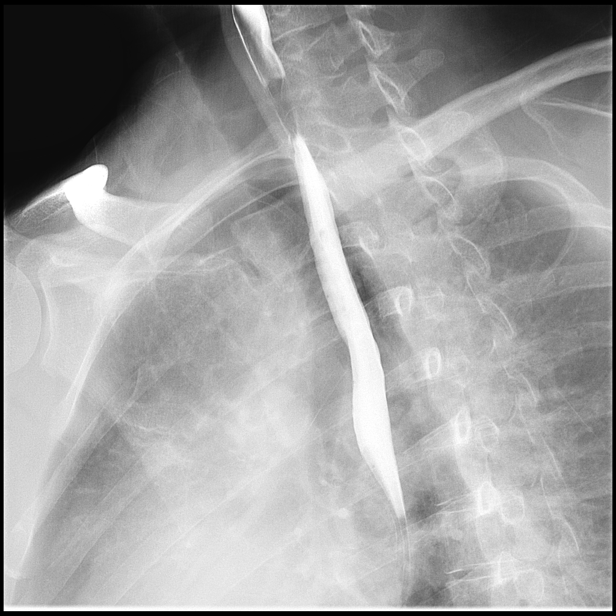
[frame 10/19]
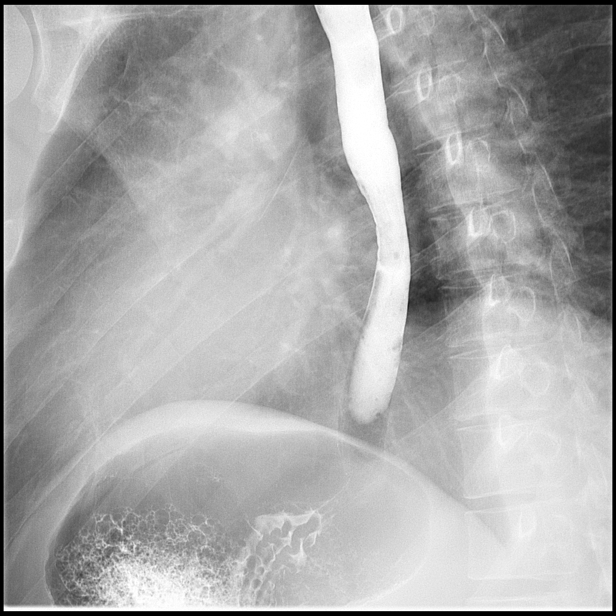
[frame 17/19]
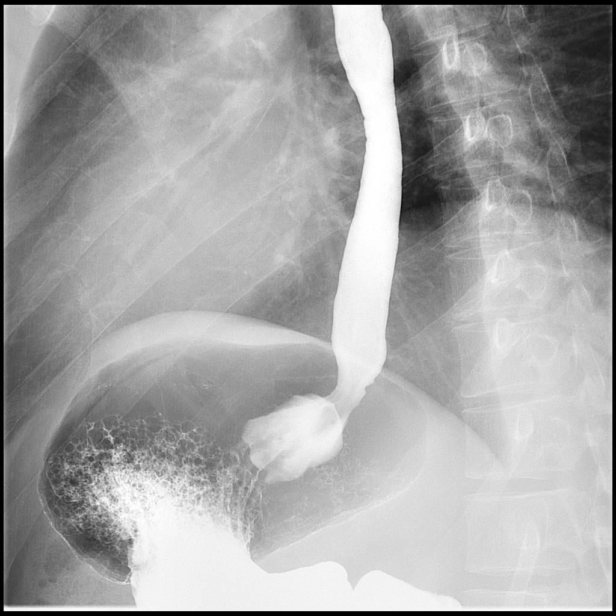

[Series 9: fluoro_barium 2fps_bw · 0.17mm/px · 1 of 1 slices shown (9 of 11)]
[im 1/1]
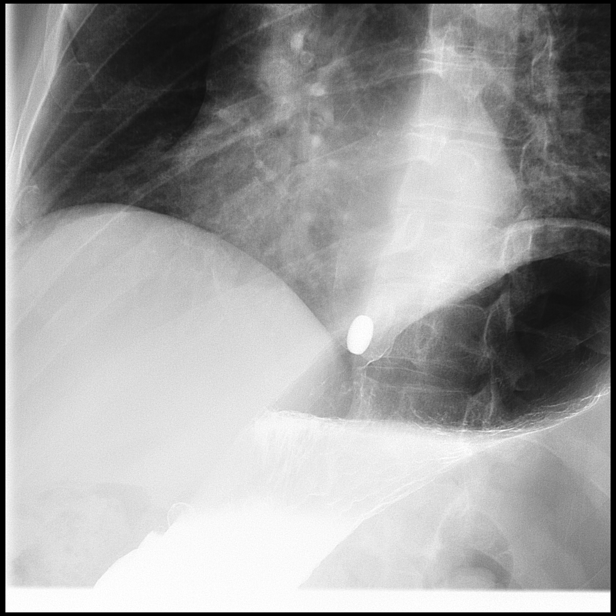

[Series 10: fluoro_barium 2fps_bw · 0.17mm/px · 1 of 1 slices shown (10 of 11)]
[im 1/1]
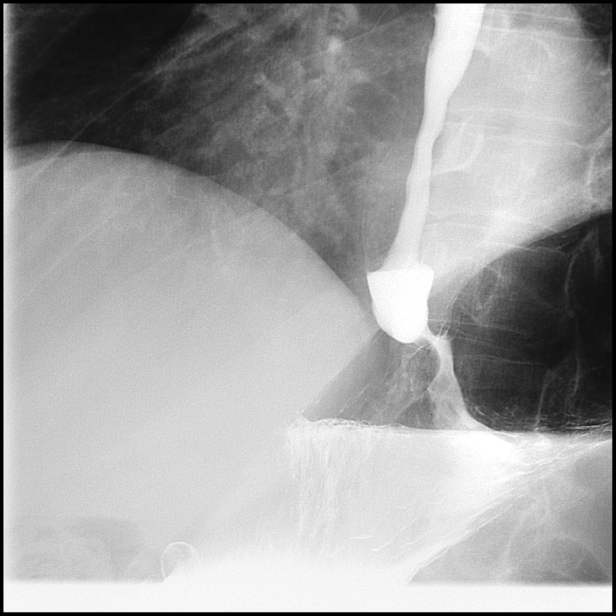

[Series 11: fluoro_barium 2fps_bw · 0.17mm/px · 1 of 1 slices shown (11 of 11)]
[im 1/1]
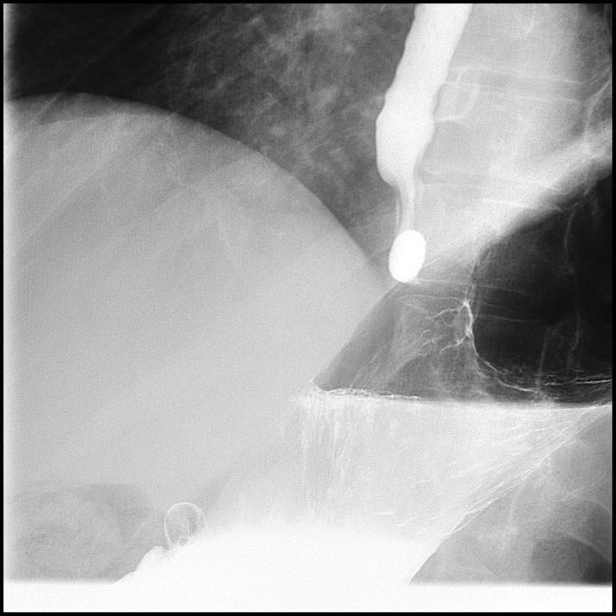

[14 of 14 positions shown; findings below may reference images not displayed]

FINDINGS: The patient ingested the thick and thin barium and the gas-forming
crystals without difficulty. The cervical esophagus distended well.
No cricopharyngeus muscle impression was observed. There was no
laryngeal penetration of the barium. The thoracic esophagus
distended well down to the GE junction. There was no obstruction to
passage of liquid barium. However, the 13 mm barium tablet hung at
the GE junction and was allowed to dissolve. No shelf or
intraluminal mass was observed. There is no hiatal hernia. In the
prone position esophageal motility was normal. No reflux was
observed.
IMPRESSION: Distal esophageal stricture obstructive to passage of the barium
tablet. No obstruction to passage of liquid barium is observed.
Direct visualization and consideration for esophageal dilation is
needed.

## 2020-05-02 ENCOUNTER — Telehealth: Payer: Self-pay | Admitting: Internal Medicine

## 2020-05-02 NOTE — Telephone Encounter (Signed)
disregard

## 2020-05-13 ENCOUNTER — Encounter: Payer: Self-pay | Admitting: Internal Medicine

## 2020-05-13 ENCOUNTER — Ambulatory Visit (INDEPENDENT_AMBULATORY_CARE_PROVIDER_SITE_OTHER): Payer: No Typology Code available for payment source | Admitting: Internal Medicine

## 2020-05-13 ENCOUNTER — Other Ambulatory Visit: Payer: Self-pay

## 2020-05-13 VITALS — BP 110/74 | HR 85 | Temp 97.7°F | Ht 69.0 in | Wt 182.1 lb

## 2020-05-13 DIAGNOSIS — R079 Chest pain, unspecified: Secondary | ICD-10-CM

## 2020-05-13 DIAGNOSIS — R0602 Shortness of breath: Secondary | ICD-10-CM

## 2020-05-13 MED ORDER — ALBUTEROL SULFATE HFA 108 (90 BASE) MCG/ACT IN AERS: 2.0000 | INHALATION_SPRAY | Freq: Four times a day (QID) | RESPIRATORY_TRACT | 5 refills | Status: AC | PRN

## 2020-05-13 NOTE — Patient Instructions (Signed)
The patient should have follow up scheduled with myself in 1 months.   Prior to next visit patient should have: Full set of PFTs - one hour  Take the albuterol rescue inhaler every 4 to 6 hours as needed for wheezing or shortness of breath. You can also take it 15 minutes before exercise or exertional activity. Side effects include heart racing or pounding, jitters or anxiety. If you have a history of an irregular heart rhythm, it can make this worse. Can also give some patients a hard time sleeping.  To inhale the aerosol using an inhaler, follow these steps:  1. Remove the protective dust cap from the end of the mouthpiece. If the dust cap was not placed on the mouthpiece, check the mouthpiece for dirt or other objects. Be sure that the canister is fully and firmly inserted in the mouthpiece. 2. If you are using the inhaler for the first time or if you have not used the inhaler in more than 14 days, you will need to prime it. You may also need to prime the inhaler if it has been dropped. Ask your pharmacist or check the manufacturer's information if this happens. To prime the inhaler, shake it well and then press down on the canister 4 times to release 4 sprays into the air, away from your face. Be careful not to get albuterol in your eyes. 3. Shake the inhaler well. 4. Breathe out as completely as possible through your mouth. 4. Hold the canister with the mouthpiece on the bottom, facing you and the canister pointing upward. Place the open end of the mouthpiece into your mouth. Close your lips tightly around the mouthpiece. 6. Breathe in slowly and deeply through the mouthpiece.At the same time, press down once on the container to spray the medication into your mouth. 7. Try to hold your breath for 10 seconds. remove the inhaler, and breathe out slowly. 8. If you were told to use 2 puffs, wait 1 minute and then repeat steps 3-7. 9. Replace the protective cap on the inhaler. 10. Clean your  inhaler regularly. Follow the manufacturer's directions carefully and ask your doctor or pharmacist if you have any questions about cleaning your inhaler.  Check the back of the inhaler to keep track of the total number of doses left on the inhaler.    

## 2020-05-13 NOTE — Progress Notes (Signed)
The patient has been prescribed the inhaler albuterol. Inhaler technique was demonstrated to patient. The patient subsequently demonstrated correct technique.  

## 2020-05-13 NOTE — Progress Notes (Signed)
Kendra Woolford    [id number]    1976-02-21  Primary Care Physician:Moreira, Pincus Large, PA-C  Referring Physician: Verner Mould, MD [address] [place],  [place] [address] Reason for Consultation: shortness of breath Date of Consultation: 05/13/2020  Chief complaint:   Chief Complaint  Patient presents with  . Consult    Pt had covid back in 2020.  Has been having issues with lungs since that time.  He stated that he is now only able to do half of the work that he was doing prior due to his chest hurting .  Pt stated that he feels [facility] when he is up moving around.  Denies any cough     HPI: Albert Compton is a 45 y.o. gentleman who had COVID 19 infection in December 2020.   Had an abnormal CT chest at that time showing GGOs.   Had ongoing chest pain with exertion on and off. All got worse when he had a kidney stone and UTI in December 2021. At that time he had another upper respiratory infection.   Shortness of breath and chest pain in multiple sites which is a dull burning pain all over his chest. Worse when he takes a deep breath in. Previously was a sharp stabbing pain. Worse when he is laying down. Has puls-oximeter at home and his oxygenation drops when he lays down. Made better with rest.   Never been prescribed inhalers or breathing treatments.   Had PFTs done for previous work related jobs. Has worn respirators in the past. Last done in 2018. Was told it was normal.   No childhood respiratory disease.   He does have eosinophilic esophagitis and is on protonix. Previously treated with budesonide. Has had multiple esophageal dilations.   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  Exposures: has previously worked around Programmer, systems. Worn respiratory masks in the past.  Smoking history: never smoker, but both parents smoked in childhood.   Social History   Occupational  History  . Occupation: Water quality scientist  Tobacco Use  . Smoking status: Never Smoker  . Smokeless tobacco: Never Used  Substance and Sexual Activity  . Alcohol use: No  . Drug use: No  . Sexual activity: Not on file    Relevant family history: Family History  Problem Relation Age of Onset  . Alcohol abuse Mother   . Hypertension Mother   . Alcohol abuse Father   . Mental illness Father   . Alcohol abuse Sister   . Mental illness Sister   . Alcohol abuse Maternal Aunt   . Alcohol abuse Maternal Uncle   . Alcohol abuse Paternal Aunt   . Alcohol abuse Paternal Uncle   . Asthma Son   . Colon cancer Neg Hx     Past Medical History:  Diagnosis Date  . Allergy   . Candidal esophagitis (HCC)    Recurrent  . Chicken pox   . Depression   . Diverticulitis   . GERD (gastroesophageal reflux disease)   . History of kidney stones     Past Surgical History:  Procedure Laterality Date  . ESOPHAGOGASTRODUODENOSCOPY (EGD) WITH PROPOFOL N/A 08/31/2015   Procedure: ESOPHAGOGASTRODUODENOSCOPY (EGD) WITH PROPOFOL;  Surgeon: Christena Deem, MD;  Location[facility] ENDOSCOPY;  Service: Endoscopy;  Laterality: N/A;  . HERNIA REPAIR  2012   umbilical  . ROTATOR CUFF REPAIR Right   .  VASECTOMY       Physical Exam: Blood pressure 110/74, pulse 85, temperature 97.7 F (36.5 C), temperature source Tympanic, height 5\' 9"  (1.753 m), weight 182 lb 1 oz (82.6 kg), SpO2 97 %. Gen:      No acute distress ENT:  no nasal polyps, mucus membranes moist Lungs:    No increased respiratory effort, symmetric chest wall excursion, clear to auscultation bilaterally, no wheezes or crackles CV:         Regular rate and rhythm; no murmurs, rubs, or gallops.  No pedal edema Abd:      + bowel sounds; soft, non-tender; no distension MSK: no acute synovitis of DIP or PIP joints, no mechanics hands.  Skin:      Warm and dry; no rashes Neuro: normal speech, no focal facial asymmetry Psych: alert and  oriented x3, normal mood and affect   Data Reviewed/Medical Decision Making:  Independent interpretation of tests: Imaging: No direct imaging available for review. Was able to see CT scan from December 2021 when patient was sick with UTI. CT Chest showed:  1. No evidence of pulmonary emboli to the proximal segmental level. 2. Lungs are mostly clear however there are a few patchy ground glass opacities as can be seen in the setting of viral infection. 3. Several small pulmonary nodules measuring up to 6 mm. Recommend follow-up as below.   PFTs: Done through occupational health previously spirometry was normal.   Labs:  Lab Results  Component Value Date   WBC 9.5 08/31/2014   HGB 16.9 08/31/2014   HCT 50.0 08/31/2014   MCV 89.9 08/31/2014   PLT 209.0 08/31/2014   Lab Results  Component Value Date   NA 136 08/31/2014   K 4.1 08/31/2014   CL 102 08/31/2014   CO2 30 08/31/2014   covid positive 2020  Immunization status:  Immunization History  Administered Date(s) Administered  . Influenza, Seasonal, Injecte, Preservative Fre 01/08/2017  . Influenza-Unspecified 01/08/2017  . PFIZER(Purple Top)SARS-COV-2 Vaccination 06/29/2019, 07/20/2019  . Tdap 03/26/2009    . I reviewed prior external note(s) from [facility] health, cardiology . I reviewed the result(s) of the labs and imaging as noted above.  . I have ordered PFT   Assessment:  Shortness of breath Chest tightness Multiple 76mm or less pulmonary nodules - low risk patient  Covid 19 infection, December 2020  Plan/Recommendations:  Differential diagnosis includes asthma or reactive airways disease, long haul covid symptoms.  Will trial albuterol prn.  Will obtain a full set of PFTs and see him back after this.  Based on Fleischner criteria, no further follow up needed for pulmonary nodules reported in this low risk patient.   We discussed disease management and progression at length today.   I spent 45 minutes in  the care of this patient today including pre-charting, chart review, review of results, face-to-face care, coordination of care and communication with consultants etc.).  Return to Care: Return in about 4 weeks (around 06/10/2020).  06/12/2020, MD Pulmonary and Critical Care Medicine [facility] [phone]  CC: Durel Salts, MD

## 2020-06-20 ENCOUNTER — Ambulatory Visit (INDEPENDENT_AMBULATORY_CARE_PROVIDER_SITE_OTHER): Payer: No Typology Code available for payment source | Admitting: Internal Medicine

## 2020-06-20 ENCOUNTER — Other Ambulatory Visit: Payer: Self-pay

## 2020-06-20 ENCOUNTER — Encounter: Payer: Self-pay | Admitting: Internal Medicine

## 2020-06-20 VITALS — BP 122/84 | HR 97 | Temp 98.1°F | Ht 69.0 in | Wt 184.1 lb

## 2020-06-20 DIAGNOSIS — R0602 Shortness of breath: Secondary | ICD-10-CM | POA: Diagnosis not present

## 2020-06-20 DIAGNOSIS — U071 COVID-19: Secondary | ICD-10-CM

## 2020-06-20 DIAGNOSIS — R5383 Other fatigue: Secondary | ICD-10-CM

## 2020-06-20 LAB — PULMONARY FUNCTION TEST
DL/VA % pred: 110 %
DL/VA: 5.09 ml/min/mmHg/L
DLCO cor % pred: 109 %
DLCO cor: 32.42 ml/min/mmHg
DLCO unc % pred: 109 %
DLCO unc: 32.42 ml/min/mmHg
FEF 25-75 Post: 4.72 L/sec
FEF 25-75 Pre: 4.43 L/sec
FEF2575-%Change-Post: 6 %
FEF2575-%Pred-Post: 126 %
FEF2575-%Pred-Pre: 118 %
FEV1-%Change-Post: 1 %
FEV1-%Pred-Post: 97 %
FEV1-%Pred-Pre: 96 %
FEV1-Post: 3.89 L
FEV1-Pre: 3.85 L
FEV1FVC-%Change-Post: 3 %
FEV1FVC-%Pred-Pre: 105 %
FEV6-%Change-Post: -2 %
FEV6-%Pred-Post: 90 %
FEV6-%Pred-Pre: 93 %
FEV6-Post: 4.47 L
FEV6-Pre: 4.6 L
FEV6FVC-%Pred-Post: 102 %
FEV6FVC-%Pred-Pre: 102 %
FVC-%Change-Post: -2 %
FVC-%Pred-Post: 88 %
FVC-%Pred-Pre: 90 %
FVC-Post: 4.47 L
FVC-Pre: 4.6 L
Post FEV1/FVC ratio: 87 %
Post FEV6/FVC ratio: 100 %
Pre FEV1/FVC ratio: 84 %
Pre FEV6/FVC Ratio: 100 %
RV % pred: 114 %
RV: 2.1 L
TLC % pred: 99 %
TLC: 6.68 L

## 2020-06-20 NOTE — Patient Instructions (Signed)
Pulmonary function test performed today. 

## 2020-06-20 NOTE — Telephone Encounter (Signed)
Patient received Covid testing at [facility]. Covid 19 PCR test is listed below.  Your COVID-19 test result NEGATIVE A negative result for this test means that SARS-CoV-2 RNA (the cause of COVID-19) was not detected in the collected sample. What does it mean to have a negative test result? A negative test result does not completely rule out being infected with COVID-19. If you test negative for COVID-19, this means the virus was not detected at the time your specimen was collected. It is still possible that you were very early in your infection at the time of your specimen collection and that you could test positive later. Also, you could be exposed later and still develop the illness. For all these reasons, it is important to follow CDC guidance, including but not limited to frequent hand washing, social distancing, wearing a face covering, covering coughs and sneezes, monitoring symptoms, and cleaning and disinfectant of frequently touched surfaces - even after a negative test result. Test information Patient's name Collection date Albert Compton June 16, 2020 at 4:30 PM EDT Patient's date of birth Collection location 1975/10/13 [address] [place], [place] [address] Test type SARS-COV-2 RNA, QL, RT PCR (COVID-19) Provider [facility] contact information Customer Service: [phone] Info about this test Performed at: [address] [facility] [address], [place], [place] [id number] Lab Director: Jolene Schimke

## 2020-06-20 NOTE — Progress Notes (Signed)
Albert Compton    [id number]    04-29-1975  Primary Care Physician:Moreira, Pincus Large, PA-C Date of Appointment: 06/20/2020 Established Patient Visit  Chief complaint:   Chief Complaint  Patient presents with  . Follow-up    SOB unchanged since last visit       HPI: Albert Compton is a 45 y.o. gentleman with COVID 19 infection in December 2020. Had   Interval Updates: Here for follow up today after PFTs.  Still extremely fatigued with minimal exertion, takes the next day to recover if he exerts himself. Here with his wife today. No chest Compton or pressure, no wheezing, chest tightness.   I have reviewed the patient's family social and past medical history and updated as appropriate.   Past Medical History:  Diagnosis Date  . Allergy   . Candidal esophagitis (HCC)    Recurrent  . Chicken pox   . Depression   . Diverticulitis   . GERD (gastroesophageal reflux disease)   . History of kidney stones     Past Surgical History:  Procedure Laterality Date  . ESOPHAGOGASTRODUODENOSCOPY (EGD) WITH PROPOFOL N/A 08/31/2015   Procedure: ESOPHAGOGASTRODUODENOSCOPY (EGD) WITH PROPOFOL;  Surgeon: Christena Deem, MD;  Location: [facility] ENDOSCOPY;  Service: Endoscopy;  Laterality: N/A;  . HERNIA REPAIR  2012   umbilical  . ROTATOR CUFF REPAIR Right   . VASECTOMY      Family History  Problem Relation Age of Onset  . Alcohol abuse Mother   . Hypertension Mother   . Alcohol abuse Father   . Mental illness Father   . Alcohol abuse Sister   . Mental illness Sister   . Alcohol abuse Maternal Aunt   . Alcohol abuse Maternal Uncle   . Alcohol abuse Paternal Aunt   . Alcohol abuse Paternal Uncle   . Asthma Son   . Colon cancer Neg Hx     Social History   Occupational History  . Occupation: Water quality scientist  Tobacco Use  . Smoking status: Never Smoker  . Smokeless tobacco: Never Used  Substance and Sexual Activity  . Alcohol use: No  . Drug use: No  . Sexual  activity: Not on file     Physical Exam: Blood pressure 122/84, pulse 97, temperature 98.1 F (36.7 C), temperature source Temporal, height 5\' 9"  (1.753 m), weight 184 lb 1.6 oz (83.5 kg), SpO2 93 %.  Gen:      No acute distress Lungs:    No increased respiratory effort, symmetric chest wall excursion, clear to auscultation bilaterally, no wheezes or crackles CV:         Regular rate and rhythm; no murmurs, rubs, or gallops.  No pedal edema   Data Reviewed: Imaging: CT [facility] at [facility] Jan 2022 shows no acute process in the lungs. No ground glass opacities, no fibrosis.   PFTs:  PFT Results Latest Ref Rng & Units 06/20/2020  FVC-Pre L 4.60  FVC-Predicted Pre % 90  FVC-Post L 4.47  FVC-Predicted Post % 88  Pre FEV1/FVC % % 84  Post FEV1/FCV % % 87  FEV1-Pre L 3.85  FEV1-Predicted Pre % 96  FEV1-Post L 3.89  DLCO uncorrected ml/min/mmHg 32.42  DLCO UNC% % 109  DLCO corrected ml/min/mmHg 32.42  DLCO COR %Predicted % 109  DLVA Predicted % 110  TLC L 6.68  TLC % Predicted % 99  RV % Predicted % 114   I have personally reviewed the patient's  PFTs and he has normal spirometry, DLCO and lung volumes.   Labs:  Immunization status: Immunization History  Administered Date(s) Administered  . Influenza, Seasonal, Injecte, Preservative Fre 01/08/2017  . Influenza-Unspecified 01/08/2017  . PFIZER(Purple Top)SARS-COV-2 Vaccination 06/29/2019, 07/20/2019  . Tdap 03/26/2009   Echocardiogram has been performed and unremarkable Feb 2022 - perserved EF and diastolic dysfunction, no significant valvular abnormalities.   Assessment:  Shortness of Breath COVID 19 infection December 2020  Plan/Recommendations: Mr. Albert Compton has ongoing symptoms of fatigue following confirmed covid infection in December 2020 which worsened following reinfection in December 2021. He has had negative cardiac work up and his pulmonary work up including CT scan and PFTs are unremarkable. He has no  evidence of post-covid pulmonary fibrosis. I think he is dealing mostly with "long covid" symptoms. I think he would be best served at a multi-disciplinary covid 19 clinic. I am referring him to [facility] for this.    I spent 30 minutes on 06/20/2020 in care of this patient including face to face time and non-face to face time spent charting, review of outside records, and coordination of care.   Return to Care: Return in about 6 months (around 12/21/2020).   Durel Salts, MD Pulmonary and Critical Care Medicine [facility] Office:[phone]

## 2020-06-20 NOTE — Patient Instructions (Signed)
The patient should have follow up scheduled with myself in 6 months.   I am sending you to the covid recovery clinic at [facility]. Let's circle back in 6 months and you can let me know how you're doing. This can also be a telephone visit if you prefer.

## 2020-06-20 NOTE — Progress Notes (Signed)
Full PFT performed today. °
